# Patient Record
Sex: Female | Born: 1935 | Race: White | Hispanic: No
Health system: Southern US, Community
[De-identification: ages and names within clinical notes are randomized; demographics above are authoritative.]

---

## 2005-06-09 ENCOUNTER — Ambulatory Visit: Payer: Self-pay | Admitting: Internal Medicine

## 2008-01-08 ENCOUNTER — Ambulatory Visit: Payer: Self-pay | Admitting: Internal Medicine

## 2010-01-20 ENCOUNTER — Ambulatory Visit: Payer: Self-pay | Admitting: Internal Medicine

## 2013-06-04 ENCOUNTER — Ambulatory Visit: Payer: Self-pay | Admitting: Internal Medicine

## 2013-06-17 ENCOUNTER — Inpatient Hospital Stay: Payer: Self-pay | Admitting: Internal Medicine

## 2013-06-17 LAB — URINALYSIS, COMPLETE
Bacteria: NONE SEEN
Glucose,UR: NEGATIVE mg/dL (ref 0–75)
Hyaline Cast: 2
Leukocyte Esterase: NEGATIVE
Protein: 30
Specific Gravity: 1.01 (ref 1.003–1.030)
Squamous Epithelial: <1

## 2013-06-17 LAB — CBC WITH DIFFERENTIAL/PLATELET
HCT: 15.2 % — ABNORMAL LOW (ref 35.0–47.0)
HGB: 5.5 g/dL — ABNORMAL LOW (ref 12.0–16.0)
Lymphocytes: 80 %
MCV: 87 fL (ref 80–100)
RDW: 14.3 % (ref 11.5–14.5)
Segmented Neutrophils: 5 %

## 2013-06-17 LAB — PROTIME-INR
INR: 1.1
Prothrombin Time: 14.7 secs (ref 11.5–14.7)

## 2013-06-17 LAB — COMPREHENSIVE METABOLIC PANEL
Albumin: 3.4 g/dL (ref 3.4–5.0)
Alkaline Phosphatase: 56 U/L (ref 50–136)
Anion Gap: 6 — ABNORMAL LOW (ref 7–16)
BUN: 20 mg/dL — ABNORMAL HIGH (ref 7–18)
Bilirubin,Total: 0.3 mg/dL (ref 0.2–1.0)
Co2: 26 mmol/L (ref 21–32)
Creatinine: 1.08 mg/dL (ref 0.60–1.30)
EGFR (African American): 55 — ABNORMAL LOW
EGFR (Non-African Amer.): 47 — ABNORMAL LOW
Glucose: 131 mg/dL — ABNORMAL HIGH (ref 65–99)
Osmolality: 280 (ref 275–301)
SGOT(AST): 27 U/L (ref 15–37)

## 2013-06-17 LAB — TROPONIN I: Troponin-I: <0.02 ng/mL

## 2013-06-17 LAB — MAGNESIUM: Magnesium: 1.8 mg/dL

## 2013-06-18 LAB — BASIC METABOLIC PANEL
Anion Gap: 6 — ABNORMAL LOW (ref 7–16)
Chloride: 108 mmol/L — ABNORMAL HIGH (ref 98–107)
Creatinine: 0.92 mg/dL (ref 0.60–1.30)
EGFR (African American): >60
EGFR (Non-African Amer.): 57 — ABNORMAL LOW
Glucose: 109 mg/dL — ABNORMAL HIGH (ref 65–99)
Osmolality: 281 (ref 275–301)

## 2013-06-18 LAB — CBC WITH DIFFERENTIAL/PLATELET
HGB: 8.4 g/dL — ABNORMAL LOW (ref 12.0–16.0)
MCH: 30.8 pg (ref 26.0–34.0)
MCV: 87 fL (ref 80–100)
Platelet: 31 10*3/uL — ABNORMAL LOW (ref 150–440)
RBC: 2.72 10*6/uL — ABNORMAL LOW (ref 3.80–5.20)
Segmented Neutrophils: 6 %
WBC: 2.9 10*3/uL — ABNORMAL LOW (ref 3.6–11.0)

## 2013-06-18 LAB — URIC ACID: Uric Acid: 6.1 mg/dL — ABNORMAL HIGH (ref 2.6–6.0)

## 2013-07-04 ENCOUNTER — Ambulatory Visit: Payer: Self-pay | Admitting: Internal Medicine

## 2013-07-04 LAB — CBC CANCER CENTER
Basophil %: 0 %
Eosinophil #: 0 x10 3/mm (ref 0.0–0.7)
HCT: 33.2 % — ABNORMAL LOW (ref 35.0–47.0)
HGB: 11.2 g/dL — ABNORMAL LOW (ref 12.0–16.0)
MCH: 29.5 pg (ref 26.0–34.0)
MCV: 87 fL (ref 80–100)
Monocyte %: 2 %
Neutrophil #: 0 x10 3/mm — ABNORMAL LOW (ref 1.4–6.5)
Neutrophil %: 7.9 %
WBC: 0.4 x10 3/mm — CL (ref 3.6–11.0)

## 2013-07-05 ENCOUNTER — Ambulatory Visit: Payer: Self-pay | Admitting: Internal Medicine

## 2013-07-11 LAB — CBC CANCER CENTER
Basophil #: 0 "x10 3/mm "
Basophil %: 0.6 %
Eosinophil #: 0 "x10 3/mm "
Eosinophil %: 0.6 %
HCT: 24.2 % — ABNORMAL LOW
HGB: 8.3 g/dL — ABNORMAL LOW
Lymphocyte %: 79.5 %
Lymphs Abs: 0.3 "x10 3/mm " — ABNORMAL LOW
MCH: 29.6 pg
MCHC: 34.2 g/dL
MCV: 87 fL
Monocyte #: 0 "x10 3/mm " — ABNORMAL LOW
Monocyte %: 0.9 %
Neutrophil #: 0.1 "x10 3/mm " — ABNORMAL LOW
Neutrophil %: 18.4 %
Platelet: 31 "x10 3/mm " — ABNORMAL LOW
RBC: 2.79 "x10 6/mm " — ABNORMAL LOW
RDW: 12.7 %
WBC: 0.4 "x10 3/mm " — CL

## 2013-07-11 LAB — CANCER CTR PLATELET CT: Platelet: 51 "x10 3/mm " — ABNORMAL LOW

## 2013-07-14 LAB — CBC CANCER CENTER
Basophil %: 0.4 %
Eosinophil #: 0 x10 3/mm (ref 0.0–0.7)
Eosinophil %: 0.4 %
HCT: 27.6 % — ABNORMAL LOW (ref 35.0–47.0)
HGB: 9.4 g/dL — ABNORMAL LOW (ref 12.0–16.0)
Lymphocyte #: 0.3 x10 3/mm — ABNORMAL LOW (ref 1.0–3.6)
MCH: 29.5 pg (ref 26.0–34.0)
MCHC: 34.2 g/dL (ref 32.0–36.0)
MCV: 86 fL (ref 80–100)
Neutrophil #: 0.1 x10 3/mm — ABNORMAL LOW (ref 1.4–6.5)
RBC: 3.2 10*6/uL — ABNORMAL LOW (ref 3.80–5.20)
RDW: 13.4 % (ref 11.5–14.5)
WBC: 0.3 x10 3/mm — CL (ref 3.6–11.0)

## 2013-07-14 LAB — CANCER CTR PLATELET CT: Platelet: 54 x10 3/mm — ABNORMAL LOW (ref 150–440)

## 2013-07-17 LAB — CBC CANCER CENTER
Basophil #: 0 x10 3/mm (ref 0.0–0.1)
Eosinophil #: 0 x10 3/mm (ref 0.0–0.7)
Eosinophil %: 2.7 %
HCT: 23 % — ABNORMAL LOW (ref 35.0–47.0)
Lymphocyte #: 0.2 x10 3/mm — ABNORMAL LOW (ref 1.0–3.6)
Lymphocyte %: 86.7 %
MCV: 86 fL (ref 80–100)
Monocyte %: 0.7 %
Neutrophil %: 9.9 %
RBC: 2.66 10*6/uL — ABNORMAL LOW (ref 3.80–5.20)
RDW: 13 % (ref 11.5–14.5)
WBC: 0.3 x10 3/mm — CL (ref 3.6–11.0)

## 2013-07-18 LAB — CBC CANCER CENTER
Basophil #: 0 x10 3/mm (ref 0.0–0.1)
Eosinophil %: 3.3 %
HCT: 22.9 % — ABNORMAL LOW (ref 35.0–47.0)
HGB: 7.7 g/dL — ABNORMAL LOW (ref 12.0–16.0)
Lymphocyte #: 0.2 x10 3/mm — ABNORMAL LOW (ref 1.0–3.6)
Lymphocyte %: 88.1 %
MCH: 29.4 pg (ref 26.0–34.0)
MCHC: 33.7 g/dL (ref 32.0–36.0)
MCV: 87 fL (ref 80–100)
Monocyte #: 0 x10 3/mm — ABNORMAL LOW (ref 0.2–0.9)
WBC: 0.3 x10 3/mm — CL (ref 3.6–11.0)

## 2013-07-21 LAB — CBC CANCER CENTER
Eosinophil #: 0 x10 3/mm (ref 0.0–0.7)
Eosinophil %: 0.2 %
HGB: 8.5 g/dL — ABNORMAL LOW (ref 12.0–16.0)
MCH: 29.6 pg (ref 26.0–34.0)
MCHC: 33.8 g/dL (ref 32.0–36.0)
MCV: 87 fL (ref 80–100)
Monocyte #: 0 x10 3/mm — ABNORMAL LOW (ref 0.2–0.9)
Monocyte %: 0.7 %
Platelet: 68 x10 3/mm — ABNORMAL LOW (ref 150–440)
RBC: 2.88 10*6/uL — ABNORMAL LOW (ref 3.80–5.20)
RDW: 13.1 % (ref 11.5–14.5)

## 2013-07-21 LAB — COMPREHENSIVE METABOLIC PANEL
Alkaline Phosphatase: 81 U/L (ref 50–136)
Bilirubin,Total: 1 mg/dL (ref 0.2–1.0)
Calcium, Total: 9.7 mg/dL (ref 8.5–10.1)
Co2: 29 mmol/L (ref 21–32)
EGFR (Non-African Amer.): 49 — ABNORMAL LOW
Glucose: 165 mg/dL — ABNORMAL HIGH (ref 65–99)
Potassium: 4.3 mmol/L (ref 3.5–5.1)
SGPT (ALT): 59 U/L (ref 12–78)
Sodium: 138 mmol/L (ref 136–145)
Total Protein: 6.7 g/dL (ref 6.4–8.2)

## 2013-07-22 LAB — CBC CANCER CENTER
HGB: 8.7 g/dL — ABNORMAL LOW (ref 12.0–16.0)
Lymphocyte #: 0.3 x10 3/mm — ABNORMAL LOW (ref 1.0–3.6)
MCHC: 33.7 g/dL (ref 32.0–36.0)
MCV: 88 fL (ref 80–100)
Monocyte #: 0 x10 3/mm — ABNORMAL LOW (ref 0.2–0.9)
Neutrophil %: 60 %
RBC: 2.92 10*6/uL — ABNORMAL LOW (ref 3.80–5.20)

## 2013-07-24 LAB — CBC CANCER CENTER
Basophil %: 1 %
Eosinophil %: 0.4 %
HCT: 24.5 % — ABNORMAL LOW (ref 35.0–47.0)
Lymphocyte #: 0.2 x10 3/mm — ABNORMAL LOW (ref 1.0–3.6)
Lymphocyte %: 25.3 %
MCH: 29.6 pg (ref 26.0–34.0)
MCHC: 32.7 g/dL (ref 32.0–36.0)
MCV: 91 fL (ref 80–100)
Monocyte #: 0 x10 3/mm — ABNORMAL LOW (ref 0.2–0.9)
Platelet: 93 x10 3/mm — ABNORMAL LOW (ref 150–440)
RBC: 2.71 10*6/uL — ABNORMAL LOW (ref 3.80–5.20)
RDW: 13.8 % (ref 11.5–14.5)
WBC: 0.6 x10 3/mm — CL (ref 3.6–11.0)

## 2013-07-28 LAB — CBC CANCER CENTER
HCT: 27.3 % — ABNORMAL LOW (ref 35.0–47.0)
HGB: 9 g/dL — ABNORMAL LOW (ref 12.0–16.0)
Lymphocyte #: 0.2 x10 3/mm — ABNORMAL LOW (ref 1.0–3.6)
MCH: 29.9 pg (ref 26.0–34.0)
MCHC: 32.9 g/dL (ref 32.0–36.0)
MCV: 91 fL (ref 80–100)
Monocyte #: 0 x10 3/mm — ABNORMAL LOW (ref 0.2–0.9)
Platelet: 159 x10 3/mm (ref 150–440)
RBC: 3.01 10*6/uL — ABNORMAL LOW (ref 3.80–5.20)
RDW: 13.9 % (ref 11.5–14.5)
WBC: 0.5 x10 3/mm — CL (ref 3.6–11.0)

## 2013-07-28 LAB — HEPATIC FUNCTION PANEL A (ARMC)
Alkaline Phosphatase: 61 U/L
Bilirubin, Direct: 0.3 mg/dL — ABNORMAL HIGH (ref 0.00–0.20)
Total Protein: 6.7 g/dL (ref 6.4–8.2)

## 2013-07-28 LAB — CREATININE, SERUM
Creatinine: 1.18 mg/dL (ref 0.60–1.30)
EGFR (Non-African Amer.): 42 — ABNORMAL LOW

## 2013-08-01 LAB — CBC CANCER CENTER
Eosinophil #: 0 x10 3/mm (ref 0.0–0.7)
HCT: 28.6 % — ABNORMAL LOW (ref 35.0–47.0)
HGB: 9.2 g/dL — ABNORMAL LOW (ref 12.0–16.0)
Lymphocyte #: 0.2 x10 3/mm — ABNORMAL LOW (ref 1.0–3.6)
MCH: 30.9 pg (ref 26.0–34.0)
MCHC: 32.3 g/dL (ref 32.0–36.0)
MCV: 96 fL (ref 80–100)
Monocyte %: 8 %
Neutrophil %: 65.2 %
Platelet: 226 x10 3/mm (ref 150–440)
RBC: 2.99 10*6/uL — ABNORMAL LOW (ref 3.80–5.20)
RDW: 14.4 % (ref 11.5–14.5)
WBC: 0.9 x10 3/mm — CL (ref 3.6–11.0)

## 2013-08-04 ENCOUNTER — Ambulatory Visit: Payer: Self-pay | Admitting: Internal Medicine

## 2013-08-04 LAB — CBC CANCER CENTER
Basophil #: 0 x10 3/mm (ref 0.0–0.1)
HCT: 28.2 % — ABNORMAL LOW (ref 35.0–47.0)
HGB: 9.3 g/dL — ABNORMAL LOW (ref 12.0–16.0)
Lymphocyte #: 0.2 x10 3/mm — ABNORMAL LOW (ref 1.0–3.6)
MCH: 32.1 pg (ref 26.0–34.0)
Neutrophil %: 74.4 %
Platelet: 271 x10 3/mm (ref 150–440)
RBC: 2.89 10*6/uL — ABNORMAL LOW (ref 3.80–5.20)
RDW: 15.4 % — ABNORMAL HIGH (ref 11.5–14.5)
WBC: 1.5 x10 3/mm — CL (ref 3.6–11.0)

## 2013-08-04 LAB — COMPREHENSIVE METABOLIC PANEL
Alkaline Phosphatase: 51 U/L
BUN: 20 mg/dL — ABNORMAL HIGH (ref 7–18)
Bilirubin,Total: 0.6 mg/dL (ref 0.2–1.0)
Co2: 30 mmol/L (ref 21–32)
Glucose: 148 mg/dL — ABNORMAL HIGH (ref 65–99)
Osmolality: 283 (ref 275–301)
SGOT(AST): 53 U/L — ABNORMAL HIGH (ref 15–37)
SGPT (ALT): 82 U/L — ABNORMAL HIGH (ref 12–78)
Sodium: 139 mmol/L (ref 136–145)
Total Protein: 6.5 g/dL (ref 6.4–8.2)

## 2013-08-11 LAB — COMPREHENSIVE METABOLIC PANEL
Albumin: 3.4 g/dL (ref 3.4–5.0)
Alkaline Phosphatase: 54 U/L
Anion Gap: 6 — ABNORMAL LOW (ref 7–16)
Bilirubin,Total: 1.2 mg/dL — ABNORMAL HIGH (ref 0.2–1.0)
Calcium, Total: 9.2 mg/dL (ref 8.5–10.1)
Co2: 31 mmol/L (ref 21–32)
Creatinine: 0.8 mg/dL (ref 0.60–1.30)
EGFR (Non-African Amer.): >60
Osmolality: 283 (ref 275–301)
Potassium: 4.3 mmol/L (ref 3.5–5.1)
SGOT(AST): 72 U/L — ABNORMAL HIGH (ref 15–37)
SGPT (ALT): 123 U/L — ABNORMAL HIGH (ref 12–78)
Sodium: 136 mmol/L (ref 136–145)
Total Protein: 6.3 g/dL — ABNORMAL LOW (ref 6.4–8.2)

## 2013-08-11 LAB — CBC CANCER CENTER
Basophil #: 0 x10 3/mm (ref 0.0–0.1)
Basophil %: 0.1 %
Eosinophil #: 0 x10 3/mm (ref 0.0–0.7)
Eosinophil %: 0 %
Lymphocyte #: 0.2 x10 3/mm — ABNORMAL LOW (ref 1.0–3.6)
Lymphocyte %: 6.8 %
MCH: 32.7 pg (ref 26.0–34.0)
MCV: 99 fL (ref 80–100)
Monocyte #: 0 x10 3/mm — ABNORMAL LOW (ref 0.2–0.9)
Platelet: 275 x10 3/mm (ref 150–440)
RBC: 2.72 10*6/uL — ABNORMAL LOW (ref 3.80–5.20)
RDW: 24.1 % — ABNORMAL HIGH (ref 11.5–14.5)
WBC: 2.4 x10 3/mm — ABNORMAL LOW (ref 3.6–11.0)

## 2013-08-18 LAB — HEPATIC FUNCTION PANEL A (ARMC)
Albumin: 3.5 g/dL (ref 3.4–5.0)
Alkaline Phosphatase: 57 U/L
Bilirubin, Direct: 0.2 mg/dL (ref 0.00–0.20)
SGPT (ALT): 135 U/L — ABNORMAL HIGH (ref 12–78)

## 2013-08-18 LAB — CREATININE, SERUM
Creatinine: 0.85 mg/dL (ref 0.60–1.30)
EGFR (Non-African Amer.): >60

## 2013-08-18 LAB — CBC CANCER CENTER
Basophil #: 0 x10 3/mm (ref 0.0–0.1)
Eosinophil #: 0 x10 3/mm (ref 0.0–0.7)
Eosinophil %: 1.7 %
HCT: 29 % — ABNORMAL LOW (ref 35.0–47.0)
HGB: 9.4 g/dL — ABNORMAL LOW (ref 12.0–16.0)
Lymphocyte #: 0.2 x10 3/mm — ABNORMAL LOW (ref 1.0–3.6)
Lymphocyte %: 17.8 %
MCV: 103 fL — ABNORMAL HIGH (ref 80–100)
Monocyte #: 0.3 x10 3/mm (ref 0.2–0.9)
Neutrophil #: 0.7 x10 3/mm — ABNORMAL LOW (ref 1.4–6.5)
RBC: 2.83 10*6/uL — ABNORMAL LOW (ref 3.80–5.20)
RDW: 27.3 % — ABNORMAL HIGH (ref 11.5–14.5)

## 2013-08-25 LAB — CREATININE, SERUM
Creatinine: 0.89 mg/dL (ref 0.60–1.30)
EGFR (Non-African Amer.): 60 — ABNORMAL LOW

## 2013-08-25 LAB — CBC CANCER CENTER
Basophil #: 0.1 x10 3/mm (ref 0.0–0.1)
Basophil %: 2.9 %
HCT: 36.2 % (ref 35.0–47.0)
Lymphocyte #: 0.3 x10 3/mm — ABNORMAL LOW (ref 1.0–3.6)
MCH: 33.4 pg (ref 26.0–34.0)
MCV: 103 fL — ABNORMAL HIGH (ref 80–100)
Monocyte #: 0.5 x10 3/mm (ref 0.2–0.9)
Neutrophil %: 70.6 %
Platelet: 300 x10 3/mm (ref 150–440)
RBC: 3.51 10*6/uL — ABNORMAL LOW (ref 3.80–5.20)
RDW: 26.3 % — ABNORMAL HIGH (ref 11.5–14.5)
WBC: 3.4 x10 3/mm — ABNORMAL LOW (ref 3.6–11.0)

## 2013-08-25 LAB — HEPATIC FUNCTION PANEL A (ARMC)
Bilirubin, Direct: 0.1 mg/dL (ref 0.00–0.20)
SGPT (ALT): 164 U/L — ABNORMAL HIGH (ref 12–78)
Total Protein: 6.4 g/dL (ref 6.4–8.2)

## 2013-08-25 LAB — GLUCOSE, RANDOM: Glucose: 151 mg/dL — ABNORMAL HIGH (ref 65–99)

## 2013-09-01 LAB — CBC CANCER CENTER
Basophil #: 0.1 x10 3/mm (ref 0.0–0.1)
Basophil %: 2.9 %
Eosinophil %: 1.8 %
HCT: 35.9 % (ref 35.0–47.0)
Lymphocyte %: 11.1 %
MCV: 104 fL — ABNORMAL HIGH (ref 80–100)
Monocyte #: 0.3 x10 3/mm (ref 0.2–0.9)
Monocyte %: 9.4 %
Neutrophil #: 2.7 x10 3/mm (ref 1.4–6.5)
Neutrophil %: 74.8 %
Platelet: 218 x10 3/mm (ref 150–440)
RBC: 3.46 10*6/uL — ABNORMAL LOW (ref 3.80–5.20)
RDW: 22.9 % — ABNORMAL HIGH (ref 11.5–14.5)
WBC: 3.6 x10 3/mm (ref 3.6–11.0)

## 2013-09-01 LAB — HEPATIC FUNCTION PANEL A (ARMC)
Albumin: 3.6 g/dL (ref 3.4–5.0)
Alkaline Phosphatase: 50 U/L
Bilirubin, Direct: 0.2 mg/dL (ref 0.00–0.20)
SGOT(AST): 23 U/L (ref 15–37)
SGPT (ALT): 63 U/L (ref 12–78)
Total Protein: 6.3 g/dL — ABNORMAL LOW (ref 6.4–8.2)

## 2013-09-01 LAB — CREATININE, SERUM
EGFR (African American): >60
EGFR (Non-African Amer.): >60

## 2013-09-04 ENCOUNTER — Ambulatory Visit: Payer: Self-pay | Admitting: Internal Medicine

## 2013-09-08 LAB — CBC CANCER CENTER
BASOS PCT: 0.2 %
Basophil #: 0 x10 3/mm (ref 0.0–0.1)
Eosinophil #: 0 x10 3/mm (ref 0.0–0.7)
Eosinophil %: 0.2 %
HCT: 35.4 % (ref 35.0–47.0)
HGB: 11.6 g/dL — AB (ref 12.0–16.0)
Lymphocyte #: 0.3 x10 3/mm — ABNORMAL LOW (ref 1.0–3.6)
Lymphocyte %: 7.3 %
MCH: 34.1 pg — ABNORMAL HIGH (ref 26.0–34.0)
MCHC: 32.9 g/dL (ref 32.0–36.0)
MCV: 104 fL — ABNORMAL HIGH (ref 80–100)
Monocyte #: 0.2 x10 3/mm (ref 0.2–0.9)
Monocyte %: 3.6 %
NEUTROS ABS: 4.1 x10 3/mm (ref 1.4–6.5)
NEUTROS PCT: 88.7 %
Platelet: 187 x10 3/mm (ref 150–440)
RBC: 3.41 10*6/uL — AB (ref 3.80–5.20)
RDW: 20.1 % — ABNORMAL HIGH (ref 11.5–14.5)
WBC: 4.6 x10 3/mm (ref 3.6–11.0)

## 2013-09-08 LAB — COMPREHENSIVE METABOLIC PANEL
ALBUMIN: 3.6 g/dL (ref 3.4–5.0)
ANION GAP: 9 (ref 7–16)
Alkaline Phosphatase: 51 U/L
BUN: 19 mg/dL — ABNORMAL HIGH (ref 7–18)
Bilirubin,Total: 0.7 mg/dL (ref 0.2–1.0)
CREATININE: 0.77 mg/dL (ref 0.60–1.30)
Calcium, Total: 9.5 mg/dL (ref 8.5–10.1)
Chloride: 99 mmol/L (ref 98–107)
Co2: 31 mmol/L (ref 21–32)
EGFR (African American): >60
EGFR (Non-African Amer.): >60
Glucose: 271 mg/dL — ABNORMAL HIGH (ref 65–99)
Osmolality: 289 (ref 275–301)
Potassium: 3.8 mmol/L (ref 3.5–5.1)
SGOT(AST): 24 U/L (ref 15–37)
SGPT (ALT): 73 U/L (ref 12–78)
Sodium: 139 mmol/L (ref 136–145)
Total Protein: 6 g/dL — ABNORMAL LOW (ref 6.4–8.2)

## 2013-09-15 LAB — CBC CANCER CENTER
BASOS PCT: 1 %
Basophil #: 0 x10 3/mm (ref 0.0–0.1)
Eosinophil #: 0.1 x10 3/mm (ref 0.0–0.7)
Eosinophil %: 2 %
HCT: 36.7 % (ref 35.0–47.0)
HGB: 12 g/dL (ref 12.0–16.0)
LYMPHS ABS: 0.2 x10 3/mm — AB (ref 1.0–3.6)
LYMPHS PCT: 8.9 %
MCH: 34.6 pg — ABNORMAL HIGH (ref 26.0–34.0)
MCHC: 32.6 g/dL (ref 32.0–36.0)
MCV: 106 fL — ABNORMAL HIGH (ref 80–100)
MONO ABS: 0.4 x10 3/mm (ref 0.2–0.9)
Monocyte %: 14.4 %
NEUTROS ABS: 1.9 x10 3/mm (ref 1.4–6.5)
Neutrophil %: 73.7 %
Platelet: 177 x10 3/mm (ref 150–440)
RBC: 3.46 10*6/uL — ABNORMAL LOW (ref 3.80–5.20)
RDW: 19.7 % — ABNORMAL HIGH (ref 11.5–14.5)
WBC: 2.6 x10 3/mm — ABNORMAL LOW (ref 3.6–11.0)

## 2013-09-15 LAB — CREATININE, SERUM
Creatinine: 0.78 mg/dL (ref 0.60–1.30)
EGFR (African American): >60
EGFR (Non-African Amer.): >60

## 2013-09-15 LAB — HEPATIC FUNCTION PANEL A (ARMC)
ALBUMIN: 4 g/dL (ref 3.4–5.0)
ALK PHOS: 56 U/L
AST: 48 U/L — AB (ref 15–37)
BILIRUBIN DIRECT: 0.2 mg/dL (ref 0.00–0.20)
BILIRUBIN TOTAL: 0.5 mg/dL (ref 0.2–1.0)
SGPT (ALT): 119 U/L — ABNORMAL HIGH (ref 12–78)
Total Protein: 6.4 g/dL (ref 6.4–8.2)

## 2013-09-22 LAB — HEPATIC FUNCTION PANEL A (ARMC)
ALK PHOS: 57 U/L
AST: 62 U/L — AB (ref 15–37)
Albumin: 3.9 g/dL (ref 3.4–5.0)
BILIRUBIN DIRECT: 0.1 mg/dL (ref 0.00–0.20)
Bilirubin,Total: 0.4 mg/dL (ref 0.2–1.0)
SGPT (ALT): 131 U/L — ABNORMAL HIGH (ref 12–78)
Total Protein: 6.5 g/dL (ref 6.4–8.2)

## 2013-09-22 LAB — CBC CANCER CENTER
BASOS PCT: 1.6 %
Basophil #: 0 x10 3/mm (ref 0.0–0.1)
EOS PCT: 1.5 %
Eosinophil #: 0 x10 3/mm (ref 0.0–0.7)
HCT: 37 % (ref 35.0–47.0)
HGB: 12.1 g/dL (ref 12.0–16.0)
Lymphocyte #: 0.3 x10 3/mm — ABNORMAL LOW (ref 1.0–3.6)
Lymphocyte %: 15.2 %
MCH: 35.1 pg — ABNORMAL HIGH (ref 26.0–34.0)
MCHC: 32.9 g/dL (ref 32.0–36.0)
MCV: 107 fL — ABNORMAL HIGH (ref 80–100)
MONO ABS: 0.2 x10 3/mm (ref 0.2–0.9)
Monocyte %: 12.9 %
NEUTROS PCT: 68.8 %
Neutrophil #: 1.3 x10 3/mm — ABNORMAL LOW (ref 1.4–6.5)
Platelet: 214 x10 3/mm (ref 150–440)
RBC: 3.46 10*6/uL — ABNORMAL LOW (ref 3.80–5.20)
RDW: 18.4 % — AB (ref 11.5–14.5)
WBC: 1.9 x10 3/mm — CL (ref 3.6–11.0)

## 2013-09-22 LAB — CREATININE, SERUM
Creatinine: 0.7 mg/dL (ref 0.60–1.30)
EGFR (African American): >60
EGFR (Non-African Amer.): >60

## 2013-09-29 LAB — CREATININE, SERUM
Creatinine: 0.93 mg/dL (ref 0.60–1.30)
EGFR (African American): >60
EGFR (Non-African Amer.): 56 — ABNORMAL LOW

## 2013-09-29 LAB — CBC CANCER CENTER
BASOS PCT: 1 %
Basophil #: 0 x10 3/mm (ref 0.0–0.1)
EOS ABS: 0.1 x10 3/mm (ref 0.0–0.7)
Eosinophil %: 2.7 %
HCT: 37.8 % (ref 35.0–47.0)
HGB: 12.3 g/dL (ref 12.0–16.0)
Lymphocyte #: 0.3 x10 3/mm — ABNORMAL LOW (ref 1.0–3.6)
Lymphocyte %: 10.3 %
MCH: 35.1 pg — ABNORMAL HIGH (ref 26.0–34.0)
MCHC: 32.5 g/dL (ref 32.0–36.0)
MCV: 108 fL — ABNORMAL HIGH (ref 80–100)
MONO ABS: 0.5 x10 3/mm (ref 0.2–0.9)
MONOS PCT: 17.8 %
Neutrophil #: 1.9 x10 3/mm (ref 1.4–6.5)
Neutrophil %: 68.2 %
Platelet: 204 x10 3/mm (ref 150–440)
RBC: 3.5 10*6/uL — ABNORMAL LOW (ref 3.80–5.20)
RDW: 17.7 % — ABNORMAL HIGH (ref 11.5–14.5)
WBC: 2.8 x10 3/mm — ABNORMAL LOW (ref 3.6–11.0)

## 2013-09-29 LAB — HEPATIC FUNCTION PANEL A (ARMC)
ALT: 81 U/L — AB (ref 12–78)
AST: 40 U/L — AB (ref 15–37)
Albumin: 3.5 g/dL (ref 3.4–5.0)
Alkaline Phosphatase: 53 U/L
BILIRUBIN DIRECT: 0.1 mg/dL (ref 0.00–0.20)
Bilirubin,Total: 0.5 mg/dL (ref 0.2–1.0)
TOTAL PROTEIN: 6 g/dL — AB (ref 6.4–8.2)

## 2013-10-01 LAB — HEPATIC FUNCTION PANEL A (ARMC)
Albumin: 3.6 g/dL (ref 3.4–5.0)
Alkaline Phosphatase: 57 U/L
BILIRUBIN TOTAL: 0.5 mg/dL (ref 0.2–1.0)
Bilirubin, Direct: 0.1 mg/dL (ref 0.00–0.20)
SGOT(AST): 46 U/L — ABNORMAL HIGH (ref 15–37)
SGPT (ALT): 88 U/L — ABNORMAL HIGH (ref 12–78)
Total Protein: 6.3 g/dL — ABNORMAL LOW (ref 6.4–8.2)

## 2013-10-05 ENCOUNTER — Ambulatory Visit: Payer: Self-pay | Admitting: Internal Medicine

## 2013-10-06 LAB — CBC CANCER CENTER
BASOS ABS: 0 x10 3/mm (ref 0.0–0.1)
BASOS PCT: 0.4 %
Eosinophil #: 0 x10 3/mm (ref 0.0–0.7)
Eosinophil %: 0.3 %
HCT: 40.2 % (ref 35.0–47.0)
HGB: 13.2 g/dL (ref 12.0–16.0)
LYMPHS ABS: 0.1 x10 3/mm — AB (ref 1.0–3.6)
LYMPHS PCT: 8.3 %
MCH: 34.6 pg — ABNORMAL HIGH (ref 26.0–34.0)
MCHC: 32.7 g/dL (ref 32.0–36.0)
MCV: 106 fL — AB (ref 80–100)
MONO ABS: 0 x10 3/mm — AB (ref 0.2–0.9)
Monocyte %: 4 %
NEUTROS PCT: 87 %
Neutrophil #: 0.8 x10 3/mm — ABNORMAL LOW (ref 1.4–6.5)
Platelet: 195 x10 3/mm (ref 150–440)
RBC: 3.81 10*6/uL (ref 3.80–5.20)
RDW: 16.5 % — ABNORMAL HIGH (ref 11.5–14.5)
WBC: 0.9 x10 3/mm — AB (ref 3.6–11.0)

## 2013-10-06 LAB — HEPATIC FUNCTION PANEL A (ARMC)
ALBUMIN: 3.8 g/dL (ref 3.4–5.0)
ALK PHOS: 58 U/L
ALT: 130 U/L — AB (ref 12–78)
Bilirubin, Direct: 0.1 mg/dL (ref 0.00–0.20)
Bilirubin,Total: 0.5 mg/dL (ref 0.2–1.0)
SGOT(AST): 58 U/L — ABNORMAL HIGH (ref 15–37)
Total Protein: 6.5 g/dL (ref 6.4–8.2)

## 2013-10-06 LAB — CREATININE, SERUM
CREATININE: 0.79 mg/dL (ref 0.60–1.30)
EGFR (African American): >60
EGFR (Non-African Amer.): >60

## 2013-10-13 LAB — HEPATIC FUNCTION PANEL A (ARMC)
ALBUMIN: 3.8 g/dL (ref 3.4–5.0)
ALK PHOS: 59 U/L
ALK PHOS: 64 U/L
ALT: 181 U/L — AB (ref 12–78)
AST: 60 U/L — AB (ref 15–37)
Albumin: 3.4 g/dL (ref 3.4–5.0)
BILIRUBIN DIRECT: 0.1 mg/dL (ref 0.00–0.20)
BILIRUBIN TOTAL: 0.5 mg/dL (ref 0.2–1.0)
Bilirubin, Direct: 0.1 mg/dL (ref 0.00–0.20)
Bilirubin,Total: 0.4 mg/dL (ref 0.2–1.0)
SGOT(AST): 63 U/L — ABNORMAL HIGH (ref 15–37)
SGPT (ALT): 169 U/L — ABNORMAL HIGH (ref 12–78)
TOTAL PROTEIN: 6 g/dL — AB (ref 6.4–8.2)
TOTAL PROTEIN: 6.5 g/dL (ref 6.4–8.2)

## 2013-10-13 LAB — CBC CANCER CENTER
Basophil #: 0.1 x10 3/mm (ref 0.0–0.1)
Basophil %: 5.1 %
EOS PCT: 2.8 %
Eosinophil #: 0.1 x10 3/mm (ref 0.0–0.7)
HCT: 39.2 % (ref 35.0–47.0)
HGB: 13.1 g/dL (ref 12.0–16.0)
LYMPHS ABS: 0.4 x10 3/mm — AB (ref 1.0–3.6)
Lymphocyte %: 16.4 %
MCH: 34.4 pg — ABNORMAL HIGH (ref 26.0–34.0)
MCHC: 33.4 g/dL (ref 32.0–36.0)
MCV: 103 fL — AB (ref 80–100)
MONOS PCT: 27.9 %
Monocyte #: 0.8 x10 3/mm (ref 0.2–0.9)
Neutrophil #: 1.3 x10 3/mm — ABNORMAL LOW (ref 1.4–6.5)
Neutrophil %: 47.8 %
Platelet: 212 x10 3/mm (ref 150–440)
RBC: 3.81 10*6/uL (ref 3.80–5.20)
RDW: 16.3 % — AB (ref 11.5–14.5)
WBC: 2.7 x10 3/mm — ABNORMAL LOW (ref 3.6–11.0)

## 2013-10-13 LAB — CREATININE, SERUM
CREATININE: 0.75 mg/dL (ref 0.60–1.30)
EGFR (Non-African Amer.): >60

## 2013-10-20 LAB — CBC CANCER CENTER
Basophil #: 0 x10 3/mm (ref 0.0–0.1)
Basophil %: 0.4 %
EOS ABS: 0 x10 3/mm (ref 0.0–0.7)
Eosinophil %: 0.9 %
HCT: 41 % (ref 35.0–47.0)
HGB: 13.6 g/dL (ref 12.0–16.0)
Lymphocyte #: 0.6 x10 3/mm — ABNORMAL LOW (ref 1.0–3.6)
Lymphocyte %: 11.4 %
MCH: 34.2 pg — ABNORMAL HIGH (ref 26.0–34.0)
MCHC: 33.2 g/dL (ref 32.0–36.0)
MCV: 103 fL — AB (ref 80–100)
MONO ABS: 0.6 x10 3/mm (ref 0.2–0.9)
MONOS PCT: 12.6 %
NEUTROS PCT: 74.7 %
Neutrophil #: 3.8 x10 3/mm (ref 1.4–6.5)
Platelet: 227 x10 3/mm (ref 150–440)
RBC: 3.97 10*6/uL (ref 3.80–5.20)
RDW: 15.6 % — ABNORMAL HIGH (ref 11.5–14.5)
WBC: 5 x10 3/mm (ref 3.6–11.0)

## 2013-10-20 LAB — HEPATIC FUNCTION PANEL A (ARMC)
ALBUMIN: 3.4 g/dL (ref 3.4–5.0)
Alkaline Phosphatase: 55 U/L
Bilirubin, Direct: 0.1 mg/dL (ref 0.00–0.20)
Bilirubin,Total: 0.3 mg/dL (ref 0.2–1.0)
SGOT(AST): 23 U/L (ref 15–37)
SGPT (ALT): 54 U/L (ref 12–78)
Total Protein: 6 g/dL — ABNORMAL LOW (ref 6.4–8.2)

## 2013-10-20 LAB — CREATININE, SERUM
CREATININE: 0.95 mg/dL (ref 0.60–1.30)
EGFR (African American): >60
GFR CALC NON AF AMER: 55 — AB

## 2013-10-27 LAB — HEPATIC FUNCTION PANEL A (ARMC)
ALBUMIN: 3.4 g/dL (ref 3.4–5.0)
ALK PHOS: 51 U/L
ALT: 33 U/L (ref 12–78)
BILIRUBIN TOTAL: 0.4 mg/dL (ref 0.2–1.0)
Bilirubin, Direct: 0.1 mg/dL (ref 0.00–0.20)
SGOT(AST): 23 U/L (ref 15–37)
Total Protein: 6 g/dL — ABNORMAL LOW (ref 6.4–8.2)

## 2013-10-27 LAB — CREATININE, SERUM
Creatinine: 0.81 mg/dL (ref 0.60–1.30)
EGFR (Non-African Amer.): >60

## 2013-10-27 LAB — CBC CANCER CENTER
Basophil #: 0 x10 3/mm (ref 0.0–0.1)
Basophil %: 0.6 %
EOS ABS: 0.1 x10 3/mm (ref 0.0–0.7)
EOS PCT: 1.9 %
HCT: 40.3 % (ref 35.0–47.0)
HGB: 13.2 g/dL (ref 12.0–16.0)
LYMPHS ABS: 0.4 x10 3/mm — AB (ref 1.0–3.6)
Lymphocyte %: 6.8 %
MCH: 33.8 pg (ref 26.0–34.0)
MCHC: 32.6 g/dL (ref 32.0–36.0)
MCV: 104 fL — ABNORMAL HIGH (ref 80–100)
Monocyte #: 0.6 x10 3/mm (ref 0.2–0.9)
Monocyte %: 10.7 %
NEUTROS ABS: 4.5 x10 3/mm (ref 1.4–6.5)
Neutrophil %: 80 %
Platelet: 156 x10 3/mm (ref 150–440)
RBC: 3.89 10*6/uL (ref 3.80–5.20)
RDW: 14.5 % (ref 11.5–14.5)
WBC: 5.6 x10 3/mm (ref 3.6–11.0)

## 2013-11-02 ENCOUNTER — Ambulatory Visit: Payer: Self-pay | Admitting: Internal Medicine

## 2013-11-03 LAB — CBC CANCER CENTER
BASOS ABS: 0 x10 3/mm (ref 0.0–0.1)
BASOS PCT: 0.4 %
EOS ABS: 0.1 x10 3/mm (ref 0.0–0.7)
Eosinophil %: 1.4 %
HCT: 39 % (ref 35.0–47.0)
HGB: 13 g/dL (ref 12.0–16.0)
LYMPHS ABS: 0.6 x10 3/mm — AB (ref 1.0–3.6)
LYMPHS PCT: 10.1 %
MCH: 33.9 pg (ref 26.0–34.0)
MCHC: 33.2 g/dL (ref 32.0–36.0)
MCV: 102 fL — ABNORMAL HIGH (ref 80–100)
MONO ABS: 0.1 x10 3/mm — AB (ref 0.2–0.9)
MONOS PCT: 2.2 %
Neutrophil #: 5 x10 3/mm (ref 1.4–6.5)
Neutrophil %: 85.9 %
PLATELETS: 157 x10 3/mm (ref 150–440)
RBC: 3.82 10*6/uL (ref 3.80–5.20)
RDW: 14 % (ref 11.5–14.5)
WBC: 5.8 x10 3/mm (ref 3.6–11.0)

## 2013-11-03 LAB — COMPREHENSIVE METABOLIC PANEL
ALK PHOS: 50 U/L
ALT: 53 U/L (ref 12–78)
ANION GAP: 6 — AB (ref 7–16)
Albumin: 3.5 g/dL (ref 3.4–5.0)
BUN: 20 mg/dL — AB (ref 7–18)
Bilirubin,Total: 0.6 mg/dL (ref 0.2–1.0)
CO2: 33 mmol/L — AB (ref 21–32)
CREATININE: 0.74 mg/dL (ref 0.60–1.30)
Calcium, Total: 8.9 mg/dL (ref 8.5–10.1)
Chloride: 100 mmol/L (ref 98–107)
EGFR (African American): >60
EGFR (Non-African Amer.): >60
Glucose: 190 mg/dL — ABNORMAL HIGH (ref 65–99)
OSMOLALITY: 285 (ref 275–301)
POTASSIUM: 3.7 mmol/L (ref 3.5–5.1)
SGOT(AST): 20 U/L (ref 15–37)
Sodium: 139 mmol/L (ref 136–145)
TOTAL PROTEIN: 6 g/dL — AB (ref 6.4–8.2)

## 2013-11-10 LAB — COMPREHENSIVE METABOLIC PANEL
ALT: 169 U/L — AB (ref 12–78)
Albumin: 3.8 g/dL (ref 3.4–5.0)
Alkaline Phosphatase: 57 U/L
Anion Gap: 9 (ref 7–16)
BUN: 14 mg/dL (ref 7–18)
Bilirubin,Total: 0.6 mg/dL (ref 0.2–1.0)
Calcium, Total: 10.3 mg/dL — ABNORMAL HIGH (ref 8.5–10.1)
Chloride: 100 mmol/L (ref 98–107)
Co2: 31 mmol/L (ref 21–32)
Creatinine: 0.8 mg/dL (ref 0.60–1.30)
EGFR (African American): >60
EGFR (Non-African Amer.): >60
Glucose: 219 mg/dL — ABNORMAL HIGH (ref 65–99)
OSMOLALITY: 287 (ref 275–301)
Potassium: 3.6 mmol/L (ref 3.5–5.1)
SGOT(AST): 60 U/L — ABNORMAL HIGH (ref 15–37)
Sodium: 140 mmol/L (ref 136–145)
Total Protein: 6.6 g/dL (ref 6.4–8.2)

## 2013-11-10 LAB — CBC CANCER CENTER
BASOS PCT: 4.3 %
Basophil #: 0.1 x10 3/mm (ref 0.0–0.1)
EOS PCT: 6.1 %
Eosinophil #: 0.1 x10 3/mm (ref 0.0–0.7)
HCT: 40.1 % (ref 35.0–47.0)
HGB: 13.2 g/dL (ref 12.0–16.0)
LYMPHS PCT: 26.1 %
Lymphocyte #: 0.3 x10 3/mm — ABNORMAL LOW (ref 1.0–3.6)
MCH: 33.9 pg (ref 26.0–34.0)
MCHC: 33 g/dL (ref 32.0–36.0)
MCV: 103 fL — AB (ref 80–100)
MONO ABS: 0.2 x10 3/mm (ref 0.2–0.9)
Monocyte %: 16.4 %
Neutrophil #: 0.5 x10 3/mm — ABNORMAL LOW (ref 1.4–6.5)
Neutrophil %: 47.1 %
Platelet: 225 x10 3/mm (ref 150–440)
RBC: 3.91 10*6/uL (ref 3.80–5.20)
RDW: 14.5 % (ref 11.5–14.5)
WBC: 1.2 x10 3/mm — AB (ref 3.6–11.0)

## 2013-11-17 LAB — CBC CANCER CENTER
BASOS ABS: 0.1 x10 3/mm (ref 0.0–0.1)
Basophil %: 3.6 %
EOS ABS: 0.1 x10 3/mm (ref 0.0–0.7)
Eosinophil %: 2.5 %
HCT: 41.6 % (ref 35.0–47.0)
HGB: 13.8 g/dL (ref 12.0–16.0)
LYMPHS PCT: 14.9 %
Lymphocyte #: 0.4 x10 3/mm — ABNORMAL LOW (ref 1.0–3.6)
MCH: 33.9 pg (ref 26.0–34.0)
MCHC: 33.3 g/dL (ref 32.0–36.0)
MCV: 102 fL — AB (ref 80–100)
Monocyte #: 0.6 x10 3/mm (ref 0.2–0.9)
Monocyte %: 24.4 %
NEUTROS ABS: 1.3 x10 3/mm — AB (ref 1.4–6.5)
NEUTROS PCT: 54.6 %
Platelet: 207 x10 3/mm (ref 150–440)
RBC: 4.09 10*6/uL (ref 3.80–5.20)
RDW: 14.8 % — AB (ref 11.5–14.5)
WBC: 2.4 x10 3/mm — ABNORMAL LOW (ref 3.6–11.0)

## 2013-11-17 LAB — COMPREHENSIVE METABOLIC PANEL
ALT: 134 U/L — AB (ref 12–78)
AST: 46 U/L — AB (ref 15–37)
Albumin: 3.8 g/dL (ref 3.4–5.0)
Alkaline Phosphatase: 53 U/L
Anion Gap: 6 — ABNORMAL LOW (ref 7–16)
BUN: 16 mg/dL (ref 7–18)
Bilirubin,Total: 0.6 mg/dL (ref 0.2–1.0)
CALCIUM: 9.9 mg/dL (ref 8.5–10.1)
CO2: 32 mmol/L (ref 21–32)
Chloride: 103 mmol/L (ref 98–107)
Creatinine: 0.86 mg/dL (ref 0.60–1.30)
Glucose: 78 mg/dL (ref 65–99)
Osmolality: 281 (ref 275–301)
Potassium: 4.2 mmol/L (ref 3.5–5.1)
Sodium: 141 mmol/L (ref 136–145)
Total Protein: 6.4 g/dL (ref 6.4–8.2)

## 2013-11-19 LAB — CBC CANCER CENTER
BASOS ABS: 0 x10 3/mm (ref 0.0–0.1)
BASOS PCT: 0.3 %
Eosinophil #: 0 x10 3/mm (ref 0.0–0.7)
Eosinophil %: 2 %
HCT: 41.4 % (ref 35.0–47.0)
HGB: 13.9 g/dL (ref 12.0–16.0)
Lymphocyte #: 0.3 x10 3/mm — ABNORMAL LOW (ref 1.0–3.6)
Lymphocyte %: 14.3 %
MCH: 34 pg (ref 26.0–34.0)
MCHC: 33.6 g/dL (ref 32.0–36.0)
MCV: 101 fL — AB (ref 80–100)
Monocyte #: 0.5 x10 3/mm (ref 0.2–0.9)
Monocyte %: 24.8 %
NEUTROS ABS: 1.3 x10 3/mm — AB (ref 1.4–6.5)
NEUTROS PCT: 58.6 %
Platelet: 189 x10 3/mm (ref 150–440)
RBC: 4.1 10*6/uL (ref 3.80–5.20)
RDW: 15.9 % — AB (ref 11.5–14.5)
WBC: 2.2 x10 3/mm — ABNORMAL LOW (ref 3.6–11.0)

## 2013-11-24 LAB — COMPREHENSIVE METABOLIC PANEL
ALBUMIN: 3.7 g/dL (ref 3.4–5.0)
ALK PHOS: 50 U/L
ANION GAP: 6 — AB (ref 7–16)
BUN: 16 mg/dL (ref 7–18)
Bilirubin,Total: 0.6 mg/dL (ref 0.2–1.0)
CALCIUM: 9.7 mg/dL (ref 8.5–10.1)
CO2: 32 mmol/L (ref 21–32)
Chloride: 104 mmol/L (ref 98–107)
Creatinine: 0.77 mg/dL (ref 0.60–1.30)
EGFR (African American): >60
Glucose: 142 mg/dL — ABNORMAL HIGH (ref 65–99)
Osmolality: 287 (ref 275–301)
POTASSIUM: 4 mmol/L (ref 3.5–5.1)
SGOT(AST): 30 U/L (ref 15–37)
SGPT (ALT): 71 U/L (ref 12–78)
Sodium: 142 mmol/L (ref 136–145)
Total Protein: 6.2 g/dL — ABNORMAL LOW (ref 6.4–8.2)

## 2013-11-24 LAB — CBC CANCER CENTER
BASOS ABS: 0.1 x10 3/mm (ref 0.0–0.1)
BASOS PCT: 3.4 %
Eosinophil #: 0 x10 3/mm (ref 0.0–0.7)
Eosinophil %: 2.2 %
HCT: 41.5 % (ref 35.0–47.0)
HGB: 13.9 g/dL (ref 12.0–16.0)
LYMPHS ABS: 0.3 x10 3/mm — AB (ref 1.0–3.6)
Lymphocyte %: 15.7 %
MCH: 34 pg (ref 26.0–34.0)
MCHC: 33.5 g/dL (ref 32.0–36.0)
MCV: 102 fL — ABNORMAL HIGH (ref 80–100)
Monocyte #: 0.4 x10 3/mm (ref 0.2–0.9)
Monocyte %: 23.2 %
NEUTROS PCT: 55.5 %
Neutrophil #: 0.9 x10 3/mm — ABNORMAL LOW (ref 1.4–6.5)
Platelet: 190 x10 3/mm (ref 150–440)
RBC: 4.08 10*6/uL (ref 3.80–5.20)
RDW: 15.6 % — AB (ref 11.5–14.5)
WBC: 1.7 x10 3/mm — AB (ref 3.6–11.0)

## 2013-12-01 LAB — CBC CANCER CENTER
BASOS ABS: 0.1 x10 3/mm (ref 0.0–0.1)
Basophil %: 2.1 %
Eosinophil #: 0 x10 3/mm (ref 0.0–0.7)
Eosinophil %: 0.8 %
HCT: 42 % (ref 35.0–47.0)
HGB: 14.1 g/dL (ref 12.0–16.0)
LYMPHS PCT: 18.5 %
Lymphocyte #: 0.8 x10 3/mm — ABNORMAL LOW (ref 1.0–3.6)
MCH: 33.8 pg (ref 26.0–34.0)
MCHC: 33.5 g/dL (ref 32.0–36.0)
MCV: 101 fL — ABNORMAL HIGH (ref 80–100)
Monocyte #: 0.7 x10 3/mm (ref 0.2–0.9)
Monocyte %: 16.4 %
Neutrophil #: 2.7 x10 3/mm (ref 1.4–6.5)
Neutrophil %: 62.2 %
Platelet: 151 x10 3/mm (ref 150–440)
RBC: 4.17 10*6/uL (ref 3.80–5.20)
RDW: 15.7 % — ABNORMAL HIGH (ref 11.5–14.5)
WBC: 4.4 x10 3/mm (ref 3.6–11.0)

## 2013-12-01 LAB — HEPATIC FUNCTION PANEL A (ARMC)
Albumin: 3.8 g/dL (ref 3.4–5.0)
Alkaline Phosphatase: 50 U/L
BILIRUBIN TOTAL: 0.5 mg/dL (ref 0.2–1.0)
Bilirubin, Direct: 0.1 mg/dL (ref 0.00–0.20)
SGOT(AST): 25 U/L (ref 15–37)
SGPT (ALT): 34 U/L (ref 12–78)
TOTAL PROTEIN: 6.5 g/dL (ref 6.4–8.2)

## 2013-12-01 LAB — CREATININE, SERUM
CREATININE: 0.74 mg/dL (ref 0.60–1.30)
EGFR (Non-African Amer.): >60

## 2013-12-03 ENCOUNTER — Ambulatory Visit: Payer: Self-pay | Admitting: Internal Medicine

## 2013-12-08 LAB — CBC CANCER CENTER
Basophil #: 0 x10 3/mm (ref 0.0–0.1)
Basophil %: 0.3 %
Eosinophil #: 0 x10 3/mm (ref 0.0–0.7)
Eosinophil %: 0.2 %
HCT: 40.7 % (ref 35.0–47.0)
HGB: 13.7 g/dL (ref 12.0–16.0)
Lymphocyte #: 0.2 x10 3/mm — ABNORMAL LOW (ref 1.0–3.6)
Lymphocyte %: 4.3 %
MCH: 33.8 pg (ref 26.0–34.0)
MCHC: 33.5 g/dL (ref 32.0–36.0)
MCV: 101 fL — AB (ref 80–100)
Monocyte #: 0.2 x10 3/mm (ref 0.2–0.9)
Monocyte %: 3.1 %
Neutrophil #: 4.7 x10 3/mm (ref 1.4–6.5)
Neutrophil %: 92.1 %
Platelet: 192 x10 3/mm (ref 150–440)
RBC: 4.04 10*6/uL (ref 3.80–5.20)
RDW: 14.9 % — ABNORMAL HIGH (ref 11.5–14.5)
WBC: 5.1 x10 3/mm (ref 3.6–11.0)

## 2013-12-08 LAB — COMPREHENSIVE METABOLIC PANEL
ALBUMIN: 3.9 g/dL (ref 3.4–5.0)
Alkaline Phosphatase: 47 U/L
Anion Gap: 9 (ref 7–16)
BILIRUBIN TOTAL: 1 mg/dL (ref 0.2–1.0)
BUN: 20 mg/dL — ABNORMAL HIGH (ref 7–18)
CREATININE: 0.72 mg/dL (ref 0.60–1.30)
Calcium, Total: 9.9 mg/dL (ref 8.5–10.1)
Chloride: 100 mmol/L (ref 98–107)
Co2: 32 mmol/L (ref 21–32)
GLUCOSE: 117 mg/dL — AB (ref 65–99)
OSMOLALITY: 285 (ref 275–301)
POTASSIUM: 3.8 mmol/L (ref 3.5–5.1)
SGOT(AST): 15 U/L (ref 15–37)
SGPT (ALT): 31 U/L (ref 12–78)
Sodium: 141 mmol/L (ref 136–145)
TOTAL PROTEIN: 6.6 g/dL (ref 6.4–8.2)

## 2013-12-08 LAB — HEPATIC FUNCTION PANEL A (ARMC): Bilirubin, Direct: 0.2 mg/dL (ref 0.00–0.20)

## 2013-12-15 LAB — HEPATIC FUNCTION PANEL A (ARMC)
ALK PHOS: 50 U/L
Albumin: 3.5 g/dL (ref 3.4–5.0)
Bilirubin, Direct: 0.1 mg/dL (ref 0.00–0.20)
Bilirubin,Total: 0.6 mg/dL (ref 0.2–1.0)
SGOT(AST): 25 U/L (ref 15–37)
SGPT (ALT): 43 U/L (ref 12–78)
Total Protein: 6.1 g/dL — ABNORMAL LOW (ref 6.4–8.2)

## 2013-12-15 LAB — CBC CANCER CENTER
BASOS ABS: 0 x10 3/mm (ref 0.0–0.1)
BASOS PCT: 1.6 %
Eosinophil #: 0.1 x10 3/mm (ref 0.0–0.7)
Eosinophil %: 2.8 %
HCT: 38.2 % (ref 35.0–47.0)
HGB: 12.6 g/dL (ref 12.0–16.0)
LYMPHS ABS: 0.3 x10 3/mm — AB (ref 1.0–3.6)
Lymphocyte %: 12 %
MCH: 34.2 pg — ABNORMAL HIGH (ref 26.0–34.0)
MCHC: 33 g/dL (ref 32.0–36.0)
MCV: 104 fL — AB (ref 80–100)
MONO ABS: 0.3 x10 3/mm (ref 0.2–0.9)
Monocyte %: 12.1 %
NEUTROS ABS: 1.5 x10 3/mm (ref 1.4–6.5)
Neutrophil %: 71.5 %
PLATELETS: 165 x10 3/mm (ref 150–440)
RBC: 3.68 10*6/uL — ABNORMAL LOW (ref 3.80–5.20)
RDW: 15.2 % — ABNORMAL HIGH (ref 11.5–14.5)
WBC: 2.1 x10 3/mm — AB (ref 3.6–11.0)

## 2013-12-15 LAB — CREATININE, SERUM
Creatinine: 0.85 mg/dL (ref 0.60–1.30)
EGFR (African American): >60
EGFR (Non-African Amer.): >60

## 2013-12-22 LAB — CBC CANCER CENTER
Basophil #: 0.1 x10 3/mm (ref 0.0–0.1)
Basophil %: 2.6 %
EOS ABS: 0 x10 3/mm (ref 0.0–0.7)
Eosinophil %: 2 %
HCT: 39.2 % (ref 35.0–47.0)
HGB: 13 g/dL (ref 12.0–16.0)
Lymphocyte #: 0.3 x10 3/mm — ABNORMAL LOW (ref 1.0–3.6)
Lymphocyte %: 15.6 %
MCH: 34.3 pg — ABNORMAL HIGH (ref 26.0–34.0)
MCHC: 33.3 g/dL (ref 32.0–36.0)
MCV: 103 fL — AB (ref 80–100)
MONO ABS: 0.4 x10 3/mm (ref 0.2–0.9)
Monocyte %: 19.5 %
Neutrophil #: 1.3 x10 3/mm — ABNORMAL LOW (ref 1.4–6.5)
Neutrophil %: 60.3 %
Platelet: 194 x10 3/mm (ref 150–440)
RBC: 3.81 10*6/uL (ref 3.80–5.20)
RDW: 15.4 % — ABNORMAL HIGH (ref 11.5–14.5)
WBC: 2.2 x10 3/mm — AB (ref 3.6–11.0)

## 2013-12-22 LAB — HEPATIC FUNCTION PANEL A (ARMC)
ALK PHOS: 46 U/L
ALT: 63 U/L (ref 12–78)
AST: 34 U/L (ref 15–37)
Albumin: 3.8 g/dL (ref 3.4–5.0)
BILIRUBIN DIRECT: 0.1 mg/dL (ref 0.00–0.20)
BILIRUBIN TOTAL: 0.5 mg/dL (ref 0.2–1.0)
Total Protein: 6.5 g/dL (ref 6.4–8.2)

## 2013-12-22 LAB — CREATININE, SERUM
Creatinine: 0.76 mg/dL (ref 0.60–1.30)
EGFR (African American): >60
EGFR (Non-African Amer.): >60

## 2013-12-29 LAB — CBC CANCER CENTER
Basophil #: 0 x10 3/mm (ref 0.0–0.1)
Basophil %: 0.8 %
EOS ABS: 0.1 x10 3/mm (ref 0.0–0.7)
Eosinophil %: 1.9 %
HCT: 39.4 % (ref 35.0–47.0)
HGB: 13.2 g/dL (ref 12.0–16.0)
LYMPHS ABS: 0.2 x10 3/mm — AB (ref 1.0–3.6)
LYMPHS PCT: 8.4 %
MCH: 34.4 pg — ABNORMAL HIGH (ref 26.0–34.0)
MCHC: 33.5 g/dL (ref 32.0–36.0)
MCV: 103 fL — AB (ref 80–100)
MONO ABS: 0.3 x10 3/mm (ref 0.2–0.9)
Monocyte %: 10.2 %
NEUTROS PCT: 78.7 %
Neutrophil #: 2.2 x10 3/mm (ref 1.4–6.5)
Platelet: 158 x10 3/mm (ref 150–440)
RBC: 3.84 10*6/uL (ref 3.80–5.20)
RDW: 15.4 % — AB (ref 11.5–14.5)
WBC: 2.8 x10 3/mm — AB (ref 3.6–11.0)

## 2013-12-29 LAB — HEPATIC FUNCTION PANEL A (ARMC)
ALBUMIN: 3.8 g/dL (ref 3.4–5.0)
ALK PHOS: 51 U/L
Bilirubin, Direct: 0.1 mg/dL (ref 0.00–0.20)
Bilirubin,Total: 0.5 mg/dL (ref 0.2–1.0)
SGOT(AST): 46 U/L — ABNORMAL HIGH (ref 15–37)
SGPT (ALT): 98 U/L — ABNORMAL HIGH (ref 12–78)
Total Protein: 6.5 g/dL (ref 6.4–8.2)

## 2013-12-29 LAB — CREATININE, SERUM
CREATININE: 0.71 mg/dL (ref 0.60–1.30)
EGFR (African American): >60
EGFR (Non-African Amer.): >60

## 2013-12-31 LAB — HEPATIC FUNCTION PANEL A (ARMC)
ALBUMIN: 3.5 g/dL (ref 3.4–5.0)
Alkaline Phosphatase: 47 U/L
BILIRUBIN DIRECT: 0.1 mg/dL (ref 0.00–0.20)
Bilirubin,Total: 0.6 mg/dL (ref 0.2–1.0)
SGOT(AST): 28 U/L (ref 15–37)
SGPT (ALT): 61 U/L (ref 12–78)
TOTAL PROTEIN: 6.4 g/dL (ref 6.4–8.2)

## 2014-01-02 ENCOUNTER — Ambulatory Visit: Payer: Self-pay | Admitting: Internal Medicine

## 2014-01-05 LAB — CBC CANCER CENTER
BASOS ABS: 0 x10 3/mm (ref 0.0–0.1)
BASOS PCT: 1.5 %
EOS PCT: 0.7 %
Eosinophil #: 0 x10 3/mm (ref 0.0–0.7)
HCT: 35.5 % (ref 35.0–47.0)
HGB: 12.6 g/dL (ref 12.0–16.0)
Lymphocyte #: 0.1 x10 3/mm — ABNORMAL LOW (ref 1.0–3.6)
Lymphocyte %: 6 %
MCH: 34.8 pg — ABNORMAL HIGH (ref 26.0–34.0)
MCHC: 35.4 g/dL (ref 32.0–36.0)
MCV: 98 fL (ref 80–100)
Monocyte #: 0 x10 3/mm — ABNORMAL LOW (ref 0.2–0.9)
Monocyte %: 2.1 %
NEUTROS ABS: 1.6 x10 3/mm (ref 1.4–6.5)
Neutrophil %: 89.7 %
Platelet: 162 x10 3/mm (ref 150–440)
RBC: 3.61 10*6/uL — ABNORMAL LOW (ref 3.80–5.20)
RDW: 15.5 % — AB (ref 11.5–14.5)
WBC: 1.8 x10 3/mm — AB (ref 3.6–11.0)

## 2014-01-05 LAB — CREATININE, SERUM
Creatinine: 0.59 mg/dL — ABNORMAL LOW (ref 0.60–1.30)
EGFR (Non-African Amer.): >60

## 2014-01-05 LAB — HEPATIC FUNCTION PANEL A (ARMC)
ALBUMIN: 3.5 g/dL (ref 3.4–5.0)
ALT: 59 U/L (ref 12–78)
Alkaline Phosphatase: 48 U/L
Bilirubin, Direct: 0.1 mg/dL (ref 0.00–0.20)
Bilirubin,Total: 0.5 mg/dL (ref 0.2–1.0)
SGOT(AST): 25 U/L (ref 15–37)
TOTAL PROTEIN: 6.3 g/dL — AB (ref 6.4–8.2)

## 2014-01-07 ENCOUNTER — Inpatient Hospital Stay: Payer: Self-pay | Admitting: Internal Medicine

## 2014-01-07 LAB — CBC WITH DIFFERENTIAL/PLATELET
BASOS PCT: 0 %
Basophil #: 0 10*3/uL (ref 0.0–0.1)
Eosinophil #: 0.1 10*3/uL (ref 0.0–0.7)
Eosinophil %: 12.7 %
HCT: 41.1 % (ref 35.0–47.0)
HGB: 13.8 g/dL (ref 12.0–16.0)
Lymphocyte #: 0.2 10*3/uL — ABNORMAL LOW (ref 1.0–3.6)
Lymphocyte %: 25.5 %
MCH: 33.9 pg (ref 26.0–34.0)
MCHC: 33.7 g/dL (ref 32.0–36.0)
MCV: 101 fL — ABNORMAL HIGH (ref 80–100)
MONO ABS: 0.2 x10 3/mm (ref 0.2–0.9)
Monocyte %: 21.8 %
Neutrophil #: 0.3 10*3/uL — ABNORMAL LOW (ref 1.4–6.5)
Neutrophil %: 40 %
Platelet: 190 10*3/uL (ref 150–440)
RBC: 4.08 10*6/uL (ref 3.80–5.20)
RDW: 15.5 % — ABNORMAL HIGH (ref 11.5–14.5)
WBC: 0.8 10*3/uL — CL (ref 3.6–11.0)

## 2014-01-07 LAB — MAGNESIUM: Magnesium: 1.9 mg/dL

## 2014-01-07 LAB — COMPREHENSIVE METABOLIC PANEL
ALBUMIN: 3.5 g/dL (ref 3.4–5.0)
ALK PHOS: 48 U/L
ANION GAP: 9 (ref 7–16)
BUN: 22 mg/dL — ABNORMAL HIGH (ref 7–18)
Bilirubin,Total: 0.9 mg/dL (ref 0.2–1.0)
CREATININE: 1.19 mg/dL (ref 0.60–1.30)
Calcium, Total: 9.4 mg/dL (ref 8.5–10.1)
Chloride: 91 mmol/L — ABNORMAL LOW (ref 98–107)
Co2: 31 mmol/L (ref 21–32)
EGFR (African American): 48 — ABNORMAL LOW
GFR CALC NON AF AMER: 42 — AB
GLUCOSE: 192 mg/dL — AB (ref 65–99)
OSMOLALITY: 271 (ref 275–301)
POTASSIUM: 3.5 mmol/L (ref 3.5–5.1)
SGOT(AST): 71 U/L — ABNORMAL HIGH (ref 15–37)
SGPT (ALT): 55 U/L (ref 12–78)
Sodium: 131 mmol/L — ABNORMAL LOW (ref 136–145)
TOTAL PROTEIN: 6.7 g/dL (ref 6.4–8.2)

## 2014-01-07 LAB — URINALYSIS, COMPLETE
BILIRUBIN, UR: NEGATIVE
Glucose,UR: NEGATIVE mg/dL (ref 0–75)
Leukocyte Esterase: NEGATIVE
NITRITE: NEGATIVE
PH: 5 (ref 4.5–8.0)
Specific Gravity: 1.016 (ref 1.003–1.030)
Squamous Epithelial: NONE SEEN

## 2014-01-07 LAB — TROPONIN I
TROPONIN-I: 0.12 ng/mL — AB
Troponin-I: 0.09 ng/mL — ABNORMAL HIGH
Troponin-I: 0.1 ng/mL — ABNORMAL HIGH

## 2014-01-07 LAB — CK-MB
CK-MB: 16.3 ng/mL — AB (ref 0.5–3.6)
CK-MB: 12 ng/mL — ABNORMAL HIGH (ref 0.5–3.6)

## 2014-01-07 LAB — PROTIME-INR
INR: 1
Prothrombin Time: 13.1 secs (ref 11.5–14.7)

## 2014-01-07 LAB — PHOSPHORUS: Phosphorus: 4.7 mg/dL (ref 2.5–4.9)

## 2014-01-08 LAB — BASIC METABOLIC PANEL
Anion Gap: 6 — ABNORMAL LOW (ref 7–16)
BUN: 12 mg/dL (ref 7–18)
CALCIUM: 8 mg/dL — AB (ref 8.5–10.1)
Chloride: 104 mmol/L (ref 98–107)
Co2: 30 mmol/L (ref 21–32)
Creatinine: 0.79 mg/dL (ref 0.60–1.30)
EGFR (African American): >60
GLUCOSE: 142 mg/dL — AB (ref 65–99)
Osmolality: 282 (ref 275–301)
Potassium: 3 mmol/L — ABNORMAL LOW (ref 3.5–5.1)
Sodium: 140 mmol/L (ref 136–145)

## 2014-01-08 LAB — CBC WITH DIFFERENTIAL/PLATELET
BASOS ABS: 0 10*3/uL (ref 0.0–0.1)
BASOS PCT: 0.3 %
Eosinophil #: 0 10*3/uL (ref 0.0–0.7)
Eosinophil %: 1.9 %
HCT: 34 % — ABNORMAL LOW (ref 35.0–47.0)
HGB: 11.9 g/dL — ABNORMAL LOW (ref 12.0–16.0)
LYMPHS PCT: 18.6 %
Lymphocyte #: 0.1 10*3/uL — ABNORMAL LOW (ref 1.0–3.6)
MCH: 35 pg — AB (ref 26.0–34.0)
MCHC: 35.1 g/dL (ref 32.0–36.0)
MCV: 100 fL (ref 80–100)
Monocyte #: 0 x10 3/mm — ABNORMAL LOW (ref 0.2–0.9)
Monocyte %: 5 %
NEUTROS ABS: 0.4 10*3/uL — AB (ref 1.4–6.5)
NEUTROS PCT: 74.2 %
Platelet: 157 10*3/uL (ref 150–440)
RBC: 3.41 10*6/uL — AB (ref 3.80–5.20)
RDW: 15.5 % — AB (ref 11.5–14.5)
WBC: 0.6 10*3/uL — CL (ref 3.6–11.0)

## 2014-01-08 LAB — URINE CULTURE

## 2014-01-08 LAB — CK-MB: CK-MB: 8.2 ng/mL — AB (ref 0.5–3.6)

## 2014-01-09 LAB — CBC WITH DIFFERENTIAL/PLATELET
Basophil #: 0 10*3/uL (ref 0.0–0.1)
Basophil %: 1.3 %
Eosinophil #: 0 10*3/uL (ref 0.0–0.7)
Eosinophil %: 5 %
HCT: 35.2 % (ref 35.0–47.0)
HGB: 12.4 g/dL (ref 12.0–16.0)
LYMPHS PCT: 23.3 %
Lymphocyte #: 0.1 10*3/uL — ABNORMAL LOW (ref 1.0–3.6)
MCH: 34.6 pg — ABNORMAL HIGH (ref 26.0–34.0)
MCHC: 35.2 g/dL (ref 32.0–36.0)
MCV: 99 fL (ref 80–100)
Monocyte #: 0.1 x10 3/mm — ABNORMAL LOW (ref 0.2–0.9)
Monocyte %: 12.3 %
Neutrophil #: 0.3 10*3/uL — ABNORMAL LOW (ref 1.4–6.5)
Neutrophil %: 58.1 %
PLATELETS: 167 10*3/uL (ref 150–440)
RBC: 3.58 10*6/uL — ABNORMAL LOW (ref 3.80–5.20)
RDW: 15.3 % — ABNORMAL HIGH (ref 11.5–14.5)
WBC: 0.6 10*3/uL — AB (ref 3.6–11.0)

## 2014-01-10 LAB — CBC WITH DIFFERENTIAL/PLATELET
Bands: 2 %
Basophil: 1 %
EOS PCT: 4 %
HCT: 35.2 % (ref 35.0–47.0)
HGB: 11.8 g/dL — ABNORMAL LOW (ref 12.0–16.0)
MCH: 33.6 pg (ref 26.0–34.0)
MCHC: 33.7 g/dL (ref 32.0–36.0)
MCV: 100 fL (ref 80–100)
MONOS PCT: 12 %
Metamyelocyte: 1 %
NRBC/100 WBC: 1 /
Platelet: 196 10*3/uL (ref 150–440)
RBC: 3.53 10*6/uL — ABNORMAL LOW (ref 3.80–5.20)
RDW: 15.4 % — ABNORMAL HIGH (ref 11.5–14.5)
Segmented Neutrophils: 66 %
Variant Lymphocyte - H1-Rlymph: 14 %
WBC: 2.4 10*3/uL — AB (ref 3.6–11.0)

## 2014-01-10 LAB — CREATININE, SERUM
CREATININE: 0.66 mg/dL (ref 0.60–1.30)
EGFR (African American): >60

## 2014-01-10 LAB — BASIC METABOLIC PANEL
Anion Gap: 4 — ABNORMAL LOW (ref 7–16)
BUN: 5 mg/dL — AB (ref 7–18)
CO2: 33 mmol/L — AB (ref 21–32)
Calcium, Total: 8.3 mg/dL — ABNORMAL LOW (ref 8.5–10.1)
Chloride: 102 mmol/L (ref 98–107)
Glucose: 124 mg/dL — ABNORMAL HIGH (ref 65–99)
OSMOLALITY: 276 (ref 275–301)
POTASSIUM: 3 mmol/L — AB (ref 3.5–5.1)
Sodium: 139 mmol/L (ref 136–145)

## 2014-01-10 LAB — POTASSIUM: Potassium: 3.7 mmol/L (ref 3.5–5.1)

## 2014-01-12 ENCOUNTER — Inpatient Hospital Stay: Payer: Self-pay | Admitting: Internal Medicine

## 2014-01-12 LAB — HEPATIC FUNCTION PANEL A (ARMC)
ALT: 154 U/L — AB (ref 12–78)
AST: 105 U/L — AB (ref 15–37)
Albumin: 3.1 g/dL — ABNORMAL LOW (ref 3.4–5.0)
Alkaline Phosphatase: 64 U/L
BILIRUBIN DIRECT: 0.1 mg/dL (ref 0.00–0.20)
BILIRUBIN TOTAL: 0.4 mg/dL (ref 0.2–1.0)
TOTAL PROTEIN: 6.8 g/dL (ref 6.4–8.2)

## 2014-01-12 LAB — CBC CANCER CENTER
BASOS ABS: 0 x10 3/mm (ref 0.0–0.1)
Basophil %: 0.3 %
Eosinophil #: 0 x10 3/mm (ref 0.0–0.7)
Eosinophil %: 0.1 %
HCT: 42.1 % (ref 35.0–47.0)
HGB: 14.5 g/dL (ref 12.0–16.0)
LYMPHS ABS: 0.1 x10 3/mm — AB (ref 1.0–3.6)
Lymphocyte %: 2.6 %
MCH: 34 pg (ref 26.0–34.0)
MCHC: 34.4 g/dL (ref 32.0–36.0)
MCV: 99 fL (ref 80–100)
Monocyte #: 0.9 x10 3/mm (ref 0.2–0.9)
Monocyte %: 18.2 %
NEUTROS PCT: 78.8 %
Neutrophil #: 4.1 x10 3/mm (ref 1.4–6.5)
Platelet: 268 x10 3/mm (ref 150–440)
RBC: 4.26 10*6/uL (ref 3.80–5.20)
RDW: 15.8 % — AB (ref 11.5–14.5)
WBC: 5.2 x10 3/mm (ref 3.6–11.0)

## 2014-01-12 LAB — CULTURE, BLOOD (SINGLE)

## 2014-01-12 LAB — CREATININE, SERUM
CREATININE: 0.89 mg/dL (ref 0.60–1.30)
GFR CALC NON AF AMER: 59 — AB

## 2014-01-12 LAB — MAGNESIUM: MAGNESIUM: 1.9 mg/dL

## 2014-01-12 LAB — POTASSIUM: Potassium: 4.3 mmol/L (ref 3.5–5.1)

## 2014-01-13 LAB — CBC WITH DIFFERENTIAL/PLATELET
BASOS ABS: 0 10*3/uL (ref 0.0–0.1)
Basophil %: 0.7 %
EOS ABS: 0 10*3/uL (ref 0.0–0.7)
Eosinophil %: 0 %
HCT: 36.7 % (ref 35.0–47.0)
HGB: 12.5 g/dL (ref 12.0–16.0)
Lymphocyte #: 0.2 10*3/uL — ABNORMAL LOW (ref 1.0–3.6)
Lymphocyte %: 8.4 %
MCH: 34.3 pg — AB (ref 26.0–34.0)
MCHC: 33.9 g/dL (ref 32.0–36.0)
MCV: 101 fL — ABNORMAL HIGH (ref 80–100)
MONO ABS: 0.5 x10 3/mm (ref 0.2–0.9)
MONOS PCT: 22.7 %
NEUTROS ABS: 1.5 10*3/uL (ref 1.4–6.5)
NEUTROS PCT: 68.2 %
Platelet: 191 10*3/uL (ref 150–440)
RBC: 3.63 10*6/uL — ABNORMAL LOW (ref 3.80–5.20)
RDW: 16.3 % — ABNORMAL HIGH (ref 11.5–14.5)
WBC: 2.1 10*3/uL — ABNORMAL LOW (ref 3.6–11.0)

## 2014-01-13 LAB — COMPREHENSIVE METABOLIC PANEL
ALT: 101 U/L — AB (ref 12–78)
AST: 90 U/L — AB (ref 15–37)
Albumin: 2.2 g/dL — ABNORMAL LOW (ref 3.4–5.0)
Alkaline Phosphatase: 40 U/L — ABNORMAL LOW
Anion Gap: 6 — ABNORMAL LOW (ref 7–16)
BILIRUBIN TOTAL: 0.4 mg/dL (ref 0.2–1.0)
BUN: 14 mg/dL (ref 7–18)
Calcium, Total: 8 mg/dL — ABNORMAL LOW (ref 8.5–10.1)
Chloride: 97 mmol/L — ABNORMAL LOW (ref 98–107)
Co2: 29 mmol/L (ref 21–32)
Creatinine: 0.82 mg/dL (ref 0.60–1.30)
EGFR (African American): >60
EGFR (Non-African Amer.): >60
GLUCOSE: 199 mg/dL — AB (ref 65–99)
Osmolality: 271 (ref 275–301)
POTASSIUM: 3.4 mmol/L — AB (ref 3.5–5.1)
Sodium: 132 mmol/L — ABNORMAL LOW (ref 136–145)
TOTAL PROTEIN: 5.3 g/dL — AB (ref 6.4–8.2)

## 2014-01-14 LAB — BASIC METABOLIC PANEL
ANION GAP: 4 — AB (ref 7–16)
BUN: 12 mg/dL (ref 7–18)
CALCIUM: 7.6 mg/dL — AB (ref 8.5–10.1)
CO2: 30 mmol/L (ref 21–32)
Chloride: 101 mmol/L (ref 98–107)
Creatinine: 0.77 mg/dL (ref 0.60–1.30)
EGFR (African American): >60
Glucose: 166 mg/dL — ABNORMAL HIGH (ref 65–99)
Osmolality: 274 (ref 275–301)
Potassium: 3.6 mmol/L (ref 3.5–5.1)
Sodium: 135 mmol/L — ABNORMAL LOW (ref 136–145)

## 2014-01-14 LAB — CBC WITH DIFFERENTIAL/PLATELET
Basophil #: 0 10*3/uL (ref 0.0–0.1)
Basophil %: 0.2 %
Eosinophil #: 0 10*3/uL (ref 0.0–0.7)
Eosinophil %: 0.1 %
HCT: 31.5 % — ABNORMAL LOW (ref 35.0–47.0)
HGB: 11 g/dL — AB (ref 12.0–16.0)
LYMPHS ABS: 0.2 10*3/uL — AB (ref 1.0–3.6)
LYMPHS PCT: 2.6 %
MCH: 34.9 pg — AB (ref 26.0–34.0)
MCHC: 34.9 g/dL (ref 32.0–36.0)
MCV: 100 fL (ref 80–100)
MONOS PCT: 4.2 %
Monocyte #: 0.4 x10 3/mm (ref 0.2–0.9)
Neutrophil #: 8.4 10*3/uL — ABNORMAL HIGH (ref 1.4–6.5)
Neutrophil %: 92.9 %
Platelet: 168 10*3/uL (ref 150–440)
RBC: 3.15 10*6/uL — ABNORMAL LOW (ref 3.80–5.20)
RDW: 15.8 % — ABNORMAL HIGH (ref 11.5–14.5)
WBC: 9 10*3/uL (ref 3.6–11.0)

## 2014-01-15 LAB — CBC WITH DIFFERENTIAL/PLATELET
BASOS ABS: 0 10*3/uL (ref 0.0–0.1)
BASOS PCT: 0.3 %
EOS PCT: 0.5 %
Eosinophil #: 0 10*3/uL (ref 0.0–0.7)
HCT: 30 % — AB (ref 35.0–47.0)
HGB: 10.3 g/dL — ABNORMAL LOW (ref 12.0–16.0)
LYMPHS ABS: 0.2 10*3/uL — AB (ref 1.0–3.6)
Lymphocyte %: 4.1 %
MCH: 34.5 pg — ABNORMAL HIGH (ref 26.0–34.0)
MCHC: 34.4 g/dL (ref 32.0–36.0)
MCV: 100 fL (ref 80–100)
MONO ABS: 0.3 x10 3/mm (ref 0.2–0.9)
Monocyte %: 5.5 %
Neutrophil #: 5.2 10*3/uL (ref 1.4–6.5)
Neutrophil %: 89.6 %
Platelet: 184 10*3/uL (ref 150–440)
RBC: 2.99 10*6/uL — ABNORMAL LOW (ref 3.80–5.20)
RDW: 16.1 % — ABNORMAL HIGH (ref 11.5–14.5)
WBC: 5.9 10*3/uL (ref 3.6–11.0)

## 2014-01-15 LAB — CREATININE, SERUM
Creatinine: 0.68 mg/dL (ref 0.60–1.30)
EGFR (African American): >60
EGFR (Non-African Amer.): >60

## 2014-01-15 LAB — URINE CULTURE

## 2014-01-15 LAB — SGOT (AST)(ARMC): SGOT(AST): 82 U/L — ABNORMAL HIGH (ref 15–37)

## 2014-01-16 LAB — CBC WITH DIFFERENTIAL/PLATELET
Basophil #: 0 10*3/uL (ref 0.0–0.1)
Basophil %: 0.9 %
Eosinophil #: 0 10*3/uL (ref 0.0–0.7)
Eosinophil %: 1.1 %
HCT: 29.6 % — AB (ref 35.0–47.0)
HGB: 10.1 g/dL — AB (ref 12.0–16.0)
LYMPHS ABS: 0.2 10*3/uL — AB (ref 1.0–3.6)
LYMPHS PCT: 7.8 %
MCH: 34.4 pg — ABNORMAL HIGH (ref 26.0–34.0)
MCHC: 34.2 g/dL (ref 32.0–36.0)
MCV: 101 fL — ABNORMAL HIGH (ref 80–100)
MONO ABS: 0.4 x10 3/mm (ref 0.2–0.9)
MONOS PCT: 13.3 %
NEUTROS ABS: 2.1 10*3/uL (ref 1.4–6.5)
Neutrophil %: 76.9 %
Platelet: 221 10*3/uL (ref 150–440)
RBC: 2.95 10*6/uL — ABNORMAL LOW (ref 3.80–5.20)
RDW: 15.9 % — ABNORMAL HIGH (ref 11.5–14.5)
WBC: 2.8 10*3/uL — ABNORMAL LOW (ref 3.6–11.0)

## 2014-01-16 LAB — BASIC METABOLIC PANEL
ANION GAP: 3 — AB (ref 7–16)
BUN: 7 mg/dL (ref 7–18)
CHLORIDE: 106 mmol/L (ref 98–107)
CO2: 31 mmol/L (ref 21–32)
Calcium, Total: 8 mg/dL — ABNORMAL LOW (ref 8.5–10.1)
Creatinine: 0.7 mg/dL (ref 0.60–1.30)
EGFR (African American): >60
EGFR (Non-African Amer.): >60
Glucose: 143 mg/dL — ABNORMAL HIGH (ref 65–99)
Osmolality: 280 (ref 275–301)
Potassium: 3.5 mmol/L (ref 3.5–5.1)
SODIUM: 140 mmol/L (ref 136–145)

## 2014-01-16 LAB — SGOT (AST)(ARMC): AST: 73 U/L — AB (ref 15–37)

## 2014-01-17 LAB — VANCOMYCIN, TROUGH: Vancomycin, Trough: 6 ug/mL — ABNORMAL LOW (ref 10–20)

## 2014-01-17 LAB — CULTURE, BLOOD (SINGLE)

## 2014-01-18 LAB — COMPREHENSIVE METABOLIC PANEL
ALT: 66 U/L (ref 12–78)
AST: 42 U/L — AB (ref 15–37)
Albumin: 2.3 g/dL — ABNORMAL LOW (ref 3.4–5.0)
Alkaline Phosphatase: 50 U/L
Anion Gap: 5 — ABNORMAL LOW (ref 7–16)
BUN: 5 mg/dL — ABNORMAL LOW (ref 7–18)
Bilirubin,Total: 0.5 mg/dL (ref 0.2–1.0)
CALCIUM: 8.8 mg/dL (ref 8.5–10.1)
CREATININE: 0.72 mg/dL (ref 0.60–1.30)
Chloride: 103 mmol/L (ref 98–107)
Co2: 33 mmol/L — ABNORMAL HIGH (ref 21–32)
EGFR (African American): >60
EGFR (Non-African Amer.): >60
Glucose: 138 mg/dL — ABNORMAL HIGH (ref 65–99)
Osmolality: 281 (ref 275–301)
Potassium: 3.1 mmol/L — ABNORMAL LOW (ref 3.5–5.1)
SODIUM: 141 mmol/L (ref 136–145)
TOTAL PROTEIN: 5.4 g/dL — AB (ref 6.4–8.2)

## 2014-01-18 LAB — VANCOMYCIN, TROUGH: Vancomycin, Trough: 14 ug/mL (ref 10–20)

## 2014-01-18 LAB — CULTURE, BLOOD (SINGLE)

## 2014-01-18 LAB — CBC WITH DIFFERENTIAL/PLATELET
BANDS NEUTROPHIL: 1 %
Basophil: 2 %
Eosinophil: 5 %
HCT: 34.5 % — AB (ref 35.0–47.0)
HGB: 11.6 g/dL — AB (ref 12.0–16.0)
LYMPHS PCT: 17 %
MCH: 33.9 pg (ref 26.0–34.0)
MCHC: 33.5 g/dL (ref 32.0–36.0)
MCV: 101 fL — ABNORMAL HIGH (ref 80–100)
Metamyelocyte: 1 %
Monocytes: 21 %
Platelet: 315 10*3/uL (ref 150–440)
RBC: 3.41 10*6/uL — AB (ref 3.80–5.20)
RDW: 16 % — AB (ref 11.5–14.5)
Segmented Neutrophils: 53 %
WBC: 3 10*3/uL — ABNORMAL LOW (ref 3.6–11.0)

## 2014-01-19 LAB — CBC WITH DIFFERENTIAL/PLATELET
BASOS ABS: 1 %
Bands: 4 %
EOS PCT: 2 %
HCT: 35.2 % (ref 35.0–47.0)
HGB: 12 g/dL (ref 12.0–16.0)
Lymphocytes: 12 %
MCH: 34.5 pg — ABNORMAL HIGH (ref 26.0–34.0)
MCHC: 34.1 g/dL (ref 32.0–36.0)
MCV: 101 fL — ABNORMAL HIGH (ref 80–100)
METAMYELOCYTE: 1 %
Monocytes: 19 %
NRBC/100 WBC: 1 /
PLATELETS: 359 10*3/uL (ref 150–440)
RBC: 3.48 10*6/uL — ABNORMAL LOW (ref 3.80–5.20)
RDW: 16.8 % — ABNORMAL HIGH (ref 11.5–14.5)
Segmented Neutrophils: 61 %
WBC: 2.8 10*3/uL — AB (ref 3.6–11.0)

## 2014-01-19 LAB — CREATININE, SERUM
Creatinine: 0.8 mg/dL (ref 0.60–1.30)
EGFR (African American): >60

## 2014-01-19 LAB — POTASSIUM: Potassium: 3.1 mmol/L — ABNORMAL LOW (ref 3.5–5.1)

## 2014-01-19 LAB — MAGNESIUM: MAGNESIUM: 1.9 mg/dL

## 2014-01-20 LAB — CBC WITH DIFFERENTIAL/PLATELET
Basophil: 2 %
Comment - H1-Com1: NORMAL
Eosinophil: 4 %
HCT: 35.4 % (ref 35.0–47.0)
HGB: 11.8 g/dL — AB (ref 12.0–16.0)
LYMPHS PCT: 20 %
MCH: 33.9 pg (ref 26.0–34.0)
MCHC: 33.2 g/dL (ref 32.0–36.0)
MCV: 102 fL — ABNORMAL HIGH (ref 80–100)
METAMYELOCYTE: 1 %
MONOS PCT: 10 %
Myelocyte: 2 %
PLATELETS: 387 10*3/uL (ref 150–440)
RBC: 3.47 10*6/uL — ABNORMAL LOW (ref 3.80–5.20)
RDW: 16.4 % — AB (ref 11.5–14.5)
SEGMENTED NEUTROPHILS: 57 %
Variant Lymphocyte - H1-Rlymph: 4 %
WBC: 3.4 10*3/uL — AB (ref 3.6–11.0)

## 2014-01-20 LAB — CREATININE, SERUM
CREATININE: 0.63 mg/dL (ref 0.60–1.30)
EGFR (Non-African Amer.): >60

## 2014-01-20 LAB — POTASSIUM: POTASSIUM: 3.3 mmol/L — AB (ref 3.5–5.1)

## 2014-02-02 ENCOUNTER — Ambulatory Visit: Payer: Self-pay | Admitting: Internal Medicine

## 2014-04-13 ENCOUNTER — Ambulatory Visit: Payer: Self-pay | Admitting: Internal Medicine

## 2014-04-13 LAB — COMPREHENSIVE METABOLIC PANEL
ALK PHOS: 53 U/L
ANION GAP: 4 — AB (ref 7–16)
Albumin: 3.2 g/dL — ABNORMAL LOW (ref 3.4–5.0)
BUN: 11 mg/dL (ref 7–18)
Bilirubin,Total: 0.3 mg/dL (ref 0.2–1.0)
CO2: 33 mmol/L — AB (ref 21–32)
Calcium, Total: 9.5 mg/dL (ref 8.5–10.1)
Chloride: 103 mmol/L (ref 98–107)
Creatinine: 0.91 mg/dL (ref 0.60–1.30)
EGFR (Non-African Amer.): 58 — ABNORMAL LOW
Glucose: 128 mg/dL — ABNORMAL HIGH (ref 65–99)
OSMOLALITY: 280 (ref 275–301)
Potassium: 4.1 mmol/L (ref 3.5–5.1)
SGOT(AST): 15 U/L (ref 15–37)
SGPT (ALT): 17 U/L
SODIUM: 140 mmol/L (ref 136–145)
Total Protein: 6.7 g/dL (ref 6.4–8.2)

## 2014-04-13 LAB — CBC CANCER CENTER
BASOS ABS: 0 x10 3/mm (ref 0.0–0.1)
Basophil %: 0.6 %
Eosinophil #: 0.3 x10 3/mm (ref 0.0–0.7)
Eosinophil %: 7 %
HCT: 40.2 % (ref 35.0–47.0)
HGB: 13.1 g/dL (ref 12.0–16.0)
LYMPHS PCT: 10.6 %
Lymphocyte #: 0.5 x10 3/mm — ABNORMAL LOW (ref 1.0–3.6)
MCH: 29.4 pg (ref 26.0–34.0)
MCHC: 32.6 g/dL (ref 32.0–36.0)
MCV: 90 fL (ref 80–100)
Monocyte #: 0.4 x10 3/mm (ref 0.2–0.9)
Monocyte %: 9.7 %
NEUTROS ABS: 3.2 x10 3/mm (ref 1.4–6.5)
NEUTROS PCT: 72.1 %
PLATELETS: 220 x10 3/mm (ref 150–440)
RBC: 4.44 10*6/uL (ref 3.80–5.20)
RDW: 14.2 % (ref 11.5–14.5)
WBC: 4.5 x10 3/mm (ref 3.6–11.0)

## 2014-04-27 LAB — CBC CANCER CENTER
BASOS ABS: 0 x10 3/mm (ref 0.0–0.1)
BASOS PCT: 0.8 %
EOS PCT: 4.1 %
Eosinophil #: 0.2 x10 3/mm (ref 0.0–0.7)
HCT: 43.8 % (ref 35.0–47.0)
HGB: 14 g/dL (ref 12.0–16.0)
LYMPHS PCT: 10.1 %
Lymphocyte #: 0.5 x10 3/mm — ABNORMAL LOW (ref 1.0–3.6)
MCH: 29 pg (ref 26.0–34.0)
MCHC: 32 g/dL (ref 32.0–36.0)
MCV: 91 fL (ref 80–100)
MONO ABS: 0.3 x10 3/mm (ref 0.2–0.9)
MONOS PCT: 6.1 %
NEUTROS ABS: 3.7 x10 3/mm (ref 1.4–6.5)
NEUTROS PCT: 78.9 %
Platelet: 142 x10 3/mm — ABNORMAL LOW (ref 150–440)
RBC: 4.84 10*6/uL (ref 3.80–5.20)
RDW: 14.7 % — ABNORMAL HIGH (ref 11.5–14.5)
WBC: 4.7 x10 3/mm (ref 3.6–11.0)

## 2014-04-28 ENCOUNTER — Ambulatory Visit: Payer: Self-pay | Admitting: Internal Medicine

## 2014-04-30 LAB — CBC CANCER CENTER
BASOS ABS: 0 x10 3/mm (ref 0.0–0.1)
BASOS PCT: 0.7 %
EOS ABS: 0.3 x10 3/mm (ref 0.0–0.7)
Eosinophil %: 5.2 %
HCT: 45.4 % (ref 35.0–47.0)
HGB: 14.9 g/dL (ref 12.0–16.0)
LYMPHS ABS: 0.8 x10 3/mm — AB (ref 1.0–3.6)
Lymphocyte %: 13.3 %
MCH: 29.3 pg (ref 26.0–34.0)
MCHC: 32.7 g/dL (ref 32.0–36.0)
MCV: 90 fL (ref 80–100)
Monocyte #: 0.3 x10 3/mm (ref 0.2–0.9)
Monocyte %: 5.7 %
Neutrophil #: 4.6 x10 3/mm (ref 1.4–6.5)
Neutrophil %: 75.1 %
Platelet: 141 x10 3/mm — ABNORMAL LOW (ref 150–440)
RBC: 5.06 10*6/uL (ref 3.80–5.20)
RDW: 14.8 % — AB (ref 11.5–14.5)
WBC: 6.1 x10 3/mm (ref 3.6–11.0)

## 2014-05-04 LAB — CBC CANCER CENTER
Basophil #: 0 x10 3/mm (ref 0.0–0.1)
Basophil %: 0.6 %
EOS ABS: 0.3 x10 3/mm (ref 0.0–0.7)
EOS PCT: 5.3 %
HCT: 44.5 % (ref 35.0–47.0)
HGB: 14.5 g/dL (ref 12.0–16.0)
LYMPHS ABS: 0.7 x10 3/mm — AB (ref 1.0–3.6)
Lymphocyte %: 15.1 %
MCH: 29.1 pg (ref 26.0–34.0)
MCHC: 32.6 g/dL (ref 32.0–36.0)
MCV: 89 fL (ref 80–100)
Monocyte #: 0.3 x10 3/mm (ref 0.2–0.9)
Monocyte %: 6.4 %
NEUTROS ABS: 3.6 x10 3/mm (ref 1.4–6.5)
Neutrophil %: 72.6 %
Platelet: 122 x10 3/mm — ABNORMAL LOW (ref 150–440)
RBC: 4.98 10*6/uL (ref 3.80–5.20)
RDW: 14.9 % — ABNORMAL HIGH (ref 11.5–14.5)
WBC: 4.9 x10 3/mm (ref 3.6–11.0)

## 2014-05-05 ENCOUNTER — Ambulatory Visit: Payer: Self-pay | Admitting: Internal Medicine

## 2014-05-13 ENCOUNTER — Ambulatory Visit: Payer: Self-pay | Admitting: Internal Medicine

## 2014-05-13 LAB — CBC CANCER CENTER
BASOS PCT: 0.4 %
Basophil #: 0 x10 3/mm (ref 0.0–0.1)
Eosinophil #: 0.2 x10 3/mm (ref 0.0–0.7)
Eosinophil %: 3.9 %
HCT: 42.2 % (ref 35.0–47.0)
HGB: 13.8 g/dL (ref 12.0–16.0)
Lymphocyte #: 1.1 x10 3/mm (ref 1.0–3.6)
Lymphocyte %: 19.8 %
MCH: 28.5 pg (ref 26.0–34.0)
MCHC: 32.8 g/dL (ref 32.0–36.0)
MCV: 87 fL (ref 80–100)
Monocyte #: 0.4 x10 3/mm (ref 0.2–0.9)
Monocyte %: 6.3 %
NEUTROS ABS: 3.9 x10 3/mm (ref 1.4–6.5)
Neutrophil %: 69.6 %
Platelet: 97 x10 3/mm — ABNORMAL LOW (ref 150–440)
RBC: 4.85 10*6/uL (ref 3.80–5.20)
RDW: 14.7 % — ABNORMAL HIGH (ref 11.5–14.5)
WBC: 5.7 x10 3/mm (ref 3.6–11.0)

## 2014-05-13 LAB — HEPATIC FUNCTION PANEL A (ARMC)
ALBUMIN: 3.2 g/dL — AB (ref 3.4–5.0)
ALK PHOS: 55 U/L
AST: 18 U/L (ref 15–37)
BILIRUBIN TOTAL: 0.4 mg/dL (ref 0.2–1.0)
SGPT (ALT): 23 U/L
Total Protein: 7.1 g/dL (ref 6.4–8.2)

## 2014-05-13 LAB — URINALYSIS, COMPLETE
BILIRUBIN, UR: NEGATIVE
GLUCOSE, UR: NEGATIVE mg/dL (ref 0–75)
Hyaline Cast: 7
Ketone: NEGATIVE
NITRITE: NEGATIVE
PH: 6 (ref 4.5–8.0)
PROTEIN: NEGATIVE
RBC,UR: 28 /HPF (ref 0–5)
Specific Gravity: 1.014 (ref 1.003–1.030)
WBC UR: 316 /HPF (ref 0–5)

## 2014-05-13 LAB — CREATININE, SERUM
Creatinine: 1.02 mg/dL (ref 0.60–1.30)
GFR CALC AF AMER: 58 — AB
GFR CALC NON AF AMER: 50 — AB

## 2014-05-13 LAB — LACTATE DEHYDROGENASE: LDH: 264 U/L — AB (ref 81–246)

## 2014-05-15 ENCOUNTER — Emergency Department: Payer: Self-pay | Admitting: Emergency Medicine

## 2014-05-15 LAB — CBC WITH DIFFERENTIAL/PLATELET
BANDS NEUTROPHIL: 5 %
Comment - H1-Com2: NORMAL
Eosinophil: 3 %
HCT: 40.3 % (ref 35.0–47.0)
HGB: 13.5 g/dL (ref 12.0–16.0)
LYMPHS PCT: 21 %
MCH: 29.1 pg (ref 26.0–34.0)
MCHC: 33.6 g/dL (ref 32.0–36.0)
MCV: 87 fL (ref 80–100)
Metamyelocyte: 4 %
Monocytes: 5 %
Myelocyte: 1 %
PLATELETS: 78 10*3/uL — AB (ref 150–440)
RBC: 4.65 10*6/uL (ref 3.80–5.20)
RDW: 15.2 % — ABNORMAL HIGH (ref 11.5–14.5)
Segmented Neutrophils: 61 %
WBC: 5 10*3/uL (ref 3.6–11.0)

## 2014-05-15 LAB — COMPREHENSIVE METABOLIC PANEL
ALT: 15 U/L
AST: 18 U/L (ref 15–37)
Albumin: 3.2 g/dL — ABNORMAL LOW (ref 3.4–5.0)
Alkaline Phosphatase: 54 U/L
Anion Gap: 6 — ABNORMAL LOW (ref 7–16)
BUN: 16 mg/dL (ref 7–18)
Bilirubin,Total: 0.4 mg/dL (ref 0.2–1.0)
CHLORIDE: 100 mmol/L (ref 98–107)
Calcium, Total: 9.9 mg/dL (ref 8.5–10.1)
Co2: 33 mmol/L — ABNORMAL HIGH (ref 21–32)
Creatinine: 0.85 mg/dL (ref 0.60–1.30)
Glucose: 102 mg/dL — ABNORMAL HIGH (ref 65–99)
OSMOLALITY: 279 (ref 275–301)
Potassium: 3.9 mmol/L (ref 3.5–5.1)
Sodium: 139 mmol/L (ref 136–145)
Total Protein: 7.2 g/dL (ref 6.4–8.2)

## 2014-05-15 LAB — URINE CULTURE

## 2014-05-20 LAB — CULTURE, BLOOD (SINGLE)

## 2014-06-04 ENCOUNTER — Ambulatory Visit: Payer: Self-pay | Admitting: Internal Medicine

## 2014-06-04 DEATH — deceased

## 2014-12-25 NOTE — Consult Note (Signed)
Brief Consult Note: Diagnosis: pancytopenia, suspicious for acute leukemia, uti.   Patient was seen by consultant.   Discussed with Attending MD.   Comments: SEE DICTATED NOTE TO FOLLOW   PANCYTOPENIA, ENLARGED SPLEEN, RECENT WT LOSS, RAPID/FEW WEAKS, ONSET OF PROGRESSIVE WEAKNES. PETECCIAE NOTED 10 DAYS AGO, NO BLEEDING. IN AUG, EXCISION MELANOMA CHEEK AND SKIN GRAFT.INFECTED GRAFT 10 DAYS KEFLEX, THEN SITE SWELLING SO 4 DAYS BACTRIM, D/C 1 WEEK AGO FOR DIARRHEA. LAST 3 DAYS INTERMIYTENT CONFUSION, WEAKNESS, SOBOE, DIZZINESS. UTI DOCUMENTED, CIPRO STARTED YESTERDAY. CBC ABNORMAL YESTERDAY..IMP MOST LIKELY ACUTE LEUKEMIA, POSSIBLE MDS OR MYELFIBROSIS MUCH LESS LIKELY, SMEAR NOT CONSISTENT,  NO EVIDENCE VIRAL OR SEPSIS, WRONG TIME COURSE FOR DRUG, ALSO BLASTS ON SMEAR AND SPLENOMEGALY .LUNGS, ABDO,BENGN, SPLENOMEGALY .PLAN TRANSFUSE PRN IRRADIATED PRODUCTS. IF SPIKES FEVER THEN VANCO AND CEFAPIME.  CHECK URIC ACID. PLAN BM EXAM MEDICAL CENTER OPINION FOR TX OPTIONS., WILL ARRANGE AS IN OR OUT PATIENT.  Electronic Signatures: Marin RobertsGittin, Robert G (MD)  (Signed 14-Oct-14 21:26)  Authored: Brief Consult Note   Last Updated: 14-Oct-14 21:26 by Marin RobertsGittin, Robert G (MD)

## 2014-12-25 NOTE — Consult Note (Signed)
PATIENT NAME:  Sharon Suarez, Sharon Suarez MR#:  [id number] DATE OF BIRTH:  1926-09-07  DATE OF CONSULTATION:  06/17/2013  REFERRING PHYSICIAN:   CONSULTING PHYSICIAN:  Knute Neu. Lorre Nick, MD  HISTORY OF PRESENT ILLNESS: [name] is an 81 year old patient, who presented and was admitted on 10/14. She was seen in the Emergency Room by a hospitalist and has been uncomfortable on the floor when I evaluated her in the early evening. Her primary physician is Dr. Juel Burrow. Her recent includes removal of a melanoma from the face in August, and then a Mohs surgery, and than a skin graft from the right upper chest. That graft site was apparently infected and was treated with Keflex, of course, in late August early September. More recently, it looked like irritation infection of the area, so the patient was given Bactrim. He took 4 days of Bactrim, but had diarrhea and stopped. This was a week ago. The patient noted and the family noted the onset of some confused thinking and some increased general weakness starting on approximately 10/10. On 10/13, she presented to the doctor and had evidence of urine infection with abnormal UA and was started on Cipro. Labs were done and when they were available the next day, the patient was notified of critical results and sent to the hospital. Those results showed hemoglobin of 5 and platelet count was 6000. The patient presented to the Emergency Room and was found to have similar abnormal labs with white count of 2900 total with some immature cells and less than 500 neutrophils. Hemoglobin was 5.8, platelet count was 6000. The patient did not have any fever, chills, or sweats. She did indicate that she had some bruising and it was noted to be petechiae the patient said that was present for about 10 days. No melena. No hematochezia. No hematuria. No epistaxis. No gum bleeding. No headache. No dizziness. The patient has had increased shortness of breath on exertion gradually steadily probably over  a few weeks and some general weakness over 3 to 4 weeks. She did have some orthostasis and had had a 15 to 20 pound weight loss since approximately August.   REVIEW OF SYSTEMS: Additional system review was negative for visual disturbances, headache, ear or jaw pain, neck pain. No palpitations,  or chest pain. No abdominal pain, nausea, vomiting. Brief diarrhea from Bactrim, which resolved. No current hematuria or dysuria. No hot or cold intolerance, polyuria or polydipsia. There was no previous history of being anemic or having any blood disorder. No rash. No bone or joint pain or history of arthritis. No history of depression or anxiety.   PAST MEDICAL HISTORY:  1. She has a melanoma of the face.  2.  Hypertension.   ALLERGIES: None. but SULFA DRUGS RECENTLY CAUSED DIARRHEA.   SOCIAL HISTORY: No alcohol, no tobacco. Lives at home by herself with her daughter, who lives nearby. Mother died from a brain tumor.   MEDICATIONS: The patient is on amlodipine 10 mg daily along with Cipro for 1 day at 250 b.i.d.   PHYSICAL EXAMINATION:  GENERAL: The patient was alert and cooperative.  HEENT: She had pallor. When I saw her, she had received 1 unit of blood and 1 unit of platelets and was receiving a second unit of blood. Sclerae no jaundice. Mouth: No thrush.  NECK: No mass in the neck. No palpable nodes in the neck, supraclavicular, submandibular, or axilla.  LUNGS: Clear. No wheezing, rales.  ABDOMEN: Nontender. No palpable mass. Spleen was palpably enlarged,  not tender. No edema. There was scant bruising on the pretibial area and there was some petechiae in the lower extremities. There was no mucous membrane bleeding.  NEUROLOGIC: Grossly nonfocal. Alert and cooperative. She was oriented. Speech was normal. SKIN: There is evidence of an excision of a skin graft on the left face and there is a lesion area of healing graft with some exuberant granulation bearing tissue of the right upper chest with no  surrounding cellulitis. It is clean, not weeping.  LABS: Creatinine was 1.08, sodium 138,  LDH was 420. Liver functions were normal. Albumin was 3.4. Hemoglobin was 5.5, white count was 2900, platelets are 6000. The differential showed less than 300 absolute neutrophils, some lymphs and a few immature cells and was reported 12% other cells apparently somewhat prominent nucleoli suspicious for blasts.   IMPRESSION AND PLAN: The patient with pancytopenia and appears to be acute leukemia given the large spleen and the appearance of, what looks like, blasts plus the absence of any significant history, this could be MDS, no other history of bleeding, bruising or illness, though has not had a blood count a long time. There is no recent obvious bacterial or viral syndrome. The only new recent medicine was Bactrim, which can acutely cause cytopenia, but could not cause a rapid anemia in the absence of hemolysis or bleeding. 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 The patient has already had some and is to complete other transfusions and would make they were irradiated products. There is a very benign medical history.   PLAN: The patient is completing the transfusion. She received repeated uric acid and was to be checked later. It was okay. [facility], had extensive discussion with the patient and the daughter about getting a Medical Center opinion because there are some potential less aggressive treatments with particular treatments for some leukemic subtypes including particularly the pro-myelocytic leukemia. Diagnosis is to be confirmed by bone marrow, which is [facility] is going to be transferred so I made arrangements in the morning [facility] would [facility]. Continue Cipro. Antibiotics p.r.n. if the patient spiked a fever.  ____________________________ Knute Neuobert G. Lorre NickGittin, MD rgg:aw D: 06/18/2013  12:35:19 ET T: 06/18/2013 13:47:48 ET JOB#: [id number]  cc: Knute Neuobert G. Lorre NickGittin, MD, <Dictator> Marin RobertsOBERT G Chelsae Zanella MD ELECTRONICALLY SIGNED 07/23/2013 12:25

## 2014-12-25 NOTE — H&P (Signed)
PATIENT NAME:  Sharon Suarez, Sharon Suarez MR#:  [id number] DATE OF BIRTH:  02-17-1927  DATE OF ADMISSION:  06/17/2013  PRIMARY CARE PHYSICIAN: Dr. Lavera Guise.   CHIEF COMPLAINT: Generalized weakness, lethargy and dizziness.   HISTORY OF PRESENT ILLNESS: This is an 81 year old female, who presents to the hospital due to significant abnormal lab values with severe anemia and thrombocytopenia and also leukopenia. The patient apparently was recently diagnosed with a urinary tract infection at her primary care physician's office. The patient was started on oral ciprofloxacin yesterday and also had blood work drawn at Dr. Jennette Kettle office. Dr. Lavera Guise after finding the blood work, urgently called her and told her to come to the ER for further evaluation. In the Emergency Room here, the patient was noted to be severely anemic with a hemoglobin of 5 and a platelet count of 6000. Hospitalist services were contacted for treatment and evaluation. The patient does say that she has developed these lower extremity petechiae now for the past 10 days. She denies any significant bruising for any reason. She denies any melena, hematochezia any hematuria or any other associated bleeding presently. She does admit to exertional shortness of breath, but no chest pain. Positive dizziness. No syncope. She also admits to about a 20 pound weight loss since August of this year.   REVIEW OF SYSTEMS: CONSTITUTIONAL: No documented fever. Positive 20 pound weight loss since August. Positive generalized weakness. EYES: No blurry or double vision. ENT: No tinnitus or postnasal drip. No redness of the oropharynx. RESPIRATORY: No cough, no wheeze and no hemoptysis. Positive dyspnea on exertion. CARDIOVASCULAR: No chest pain, no orthopnea, no palpitations, no syncope. GASTROINTESTINAL: No nausea, no vomiting, no diarrhea. No abdominal pain. No melena or hematochezia. GENITOURINARY: No dysuria or hematuria. ENDOCRINE: No polyuria, nocturia or heat or cold  intolerance.  HEMATOLOGIC: Positive anemia. Positive petechiae. Positive easy bruising. INTEGUMENTARY: No rashes. No lesions. MUSCULOSKELETAL: No arthritis, no swelling, no gout. NEUROLOGIC: No numbness or tingling. No ataxia. No seizure activity.  PSYCHIATRIC: No anxiety. No insomnia. No ADD.   PAST MEDICAL HISTORY: Consistent with history of melanoma on her face, status post excision, history of hypertension and history of recent urinary tract infection.   ALLERGIES: SULFA DRUGS, WHICH CAUSES NAUSEA.   SOCIAL HISTORY: No smoking. No alcohol abuse. No illicit drug abuse. Lives at home by herself.   FAMILY HISTORY: Both mother and father are deceased. Mother died from a brain tumor. Father died from heart disease.   CURRENT MEDICATIONS: As follows: Amlodipine 10 mg daily and ciprofloxacin 250 mg b.i.d.   PHYSICAL EXAMINATION:  VITAL SIGNS: Temperature is 98.1, pulse 92, respirations 20, blood pressure 134/63, sats 100% on room air.  GENERAL: She is a pale-appearing female, slightly lethargic, but in no apparent distress.  HEAD, EYES, EARS, NOSE, THROAT: The patient is atraumatic, normocephalic. Her extraocular muscles are intact. Her pupils are equal and reactive to light. Sclerae anicteric. No conjunctival injection. No oropharyngeal erythema.  NECK: Supple. There is no jugular venous distention. No bruits, no lymphadenopathy, no thyromegaly.  HEART: Regular rate and rhythm. No murmurs. No rubs or clicks.  LUNGS: Clear to auscultation bilaterally. No rales. No rhonchi, no wheezes.  ABDOMEN: Soft, flat, nontender, nondistended. She has positive splenomegaly, but no rebound and no rigidity. Good bowel sounds.  EXTREMITIES: No evidence of any cyanosis, clubbing, or peripheral edema. Has +2 pedal and radial pulses bilaterally.  NEUROLOGICAL: The patient is alert, awake, and oriented x 3 with no focal motor or sensory deficits appreciated  bilaterally.  SKIN: Moist, warm. She does have a petechial  rash on her lower extremities bilaterally.  LYMPHATIC: There is no cervical or axillary lymphadenopathy.   LABORATORY DATA: Serum glucose of 131, BUN 20, creatinine 1.08, sodium 138, potassium 4, chloride 106, bicarbonate 26. The patient's LDH is 420, otherwise LFTs are within normal limits with an albumin of 3.4. White cell count 3, hematocrit 5.5, hematocrit 15.2, platelet count of 6. Manual diff is still pending.   ASSESSMENT AND PLAN: This is an 81 year old female with a history of hypertension, history of melanoma, status post excision, recent urinary tract infection, who presents to the hospital due to lethargy, weakness and dizziness, noted to have acute pancytopenia.  1.  Anemia/thrombocytopenia and leukopenia. The exact etiology of this is unclear presently, but suspicious for possible acute leukemia. Unlikely this is idiopathic thrombocytopenia purpura given the fact that all cell lines are down presently. The patient likely will need a bone marrow biopsy. I discussed the case with Dr. Inez Pilgrim, who plans performing this after the patient gets some supportive care with packed red blood cells and platelets. Her platelets and red blood cells will be eradiated. We will follow serial counts and follow her clinically.  2.  Generalized weakness. This secondary to the pancytopenia. We will follow her clinically after she gets transfused.  3.  Hypertension. The patient is presently hemodynamically stable. Continue with her Norvasc.  4.  History of recent urinary tract infection. Continue ciprofloxacin. 5.  History of malignant melanoma. This is in remission. Continue further care as per oncology.   CODE STATUS: The patient is a full code.   TIME SPENT ON ADMISSION: 50 minutes.   ____________________________ Belia Heman. Verdell Carmine, MD vjs:aw D: 06/17/2013 13:44:49 ET T: 06/17/2013 14:25:55 ET JOB#: [id number]  cc: Belia Heman. Verdell Carmine, MD, <Dictator> Henreitta Leber MD ELECTRONICALLY SIGNED 07/02/2013  14:30

## 2014-12-25 NOTE — Discharge Summary (Signed)
PATIENT NAME:  Sharon Suarez, Sharon Suarez MR#:  [id number] DATE OF BIRTH:  [birth year]  DATE OF ADMISSION:  06/17/2013 DATE OF TRANSFER: 06/18/2013  ADMITTING PHYSICIAN: Dr. Abel Presto.   DISCHARGING PHYSICIAN: Gladstone Lighter, MD   ACCEPTING FACILITY: [facility].   CONSULTATIONS IN THE HOSPITAL:  1. Oncology consultation by Dr. Inez Pilgrim.  2. Primary care physician is Dr. Cletis Athens.  DISCHARGE DIAGNOSES: 1.  Acute pancytopenia likely questionable acute leukemia, will need bone marrow biopsy.  2.  Hypertension.  3.  A history of melanoma status post excision.  4.  Recent urinary tract infection on antibiotics.   CURRENT MEDICATIONS:  1. Amlodipine 10 mg p.o. daily.  2. Ciprofloxacin 250 mg p.o. b.i.d., started on 06/17/2013.  3. Ensure 240 mL chocolate flavor twice a day with meals.  4. Tylenol 650 mg q. 4 hours p.r.n. for pain or fever.   DISCHARGE DIET: Low-sodium diet.   DISCHARGE ACTIVITY: As tolerated.    FOLLOWUP INSTRUCTIONS: The patient is being transferred to [facility] for further diagnosis and treatment.   LABORATORY AND STUDIES PRIOR TO DISCHARGE: WBC 2.9, hemoglobin 8.4, hematocrit 23.6, platelet count is 31. This was after the patient got transfusion of 2 units of packed RBC and 1 unit of platelets. LDH is elevated at 420. Hemolysis workup revealed a normal haptoglobin at 164. INR 1.1. Prothrombin time is 14.7.   Magnesium 1.8.  Hemoglobin on admission is 5.5, hematocrit 15.2, platelet count was 6000, uric acid 6.1.   Sodium 140, potassium 4.0, chloride 108, bicarbonate 26, BUN 15, creatinine 0.92, glucose 109 and calcium of 8.9. CT head without contrast showing chronic involutional changes without evidence of acute abnormalities. Urinalysis negative for any infection.   BRIEF HOSPITAL COURSE: Sharon Suarez is an 81 year old, elderly, Caucasian female with past medical history only significant for hypertension, presented to the hospital due to abnormal blood work  that was done at PCPs office on 06/16/2013. The patient's last blood work was almost 2 years ago for routine visit. Was doing fine up until three days ago when she started to have more weakness and dizziness and was seen by Dr. Lavera Guise. Blood work and urine tests were done. She was diagnosed with urinary tract infection and was discharged on Cipro at the time, but she was called again yesterday on 06/17/2013 saying that her blood counts were extremely low and she needed to go to the Emergency Room. Her hemoglobin in the ER was 5.5 and platelet count was 6. That prompted admission to the hospital. The patient denies any spontaneous bleeding, other than weakness, dizziness. she does not have any specific symptoms.   1. Acute pancytopenia. Her white count is also low along with platelets and hemoglobin, and there were some abnormal possible blast-like cells seen on the peripheral smear.  She was seen by hematologist Dr. Inez Pilgrim in the hospital and she needs a bone marrow biopsy. But because there is concern for acute leukemia speeding up the diagnosis and treatment will help, so Dr. Inez Pilgrim has spoken with Dr. Kalman Shan at [facility] and the patient was accepted for transfer to [facility]. So, the patient will be transferred whenever a bed is available. She will need a bone marrow biopsy, and further diagnostics and treatment per the hematologist. The patient has also noted 20 pound weight loss for the last couple of months and has significant splenomegaly on exam. She did receive 2 units of packed RBC transfusion and 1 unit platelet transfusion in the hospital.  2. Hypertension.  Norvasc is being continued.  3. Melanoma of the skin status post resection on her left cheek.  4. Urinary tract infection. She is on Cipro, started on 06/17/2013 and will continue that for at least five days. Her repeat urine here shows no infections.   Her course has been otherwise uneventful in the hospital.   DISCHARGE CONDITION: Guarded.   DISCHARGE  DISPOSITION: To [facility].   TIME SPENT ON DISCHARGE: 45 minutes.    ____________________________ Gladstone Lighter, MD rk:sg D: 06/18/2013 14:01:00 ET T: 06/18/2013 14:20:01 ET JOB#: [id number]  cc: Simonne Come. Inez Pilgrim, MD Gladstone Lighter, MD, <Dictator>     Gladstone Lighter MD ELECTRONICALLY SIGNED 06/21/2013 12:09

## 2014-12-26 IMAGING — CR DG CHEST 1V PORT
1 series · 1 of 1 positions shown · non-contrast
Comparison: 01/07/2014

CLINICAL DATA: Weakness and fever

EXAM:
PORTABLE CHEST - 1 VIEW

[ap]
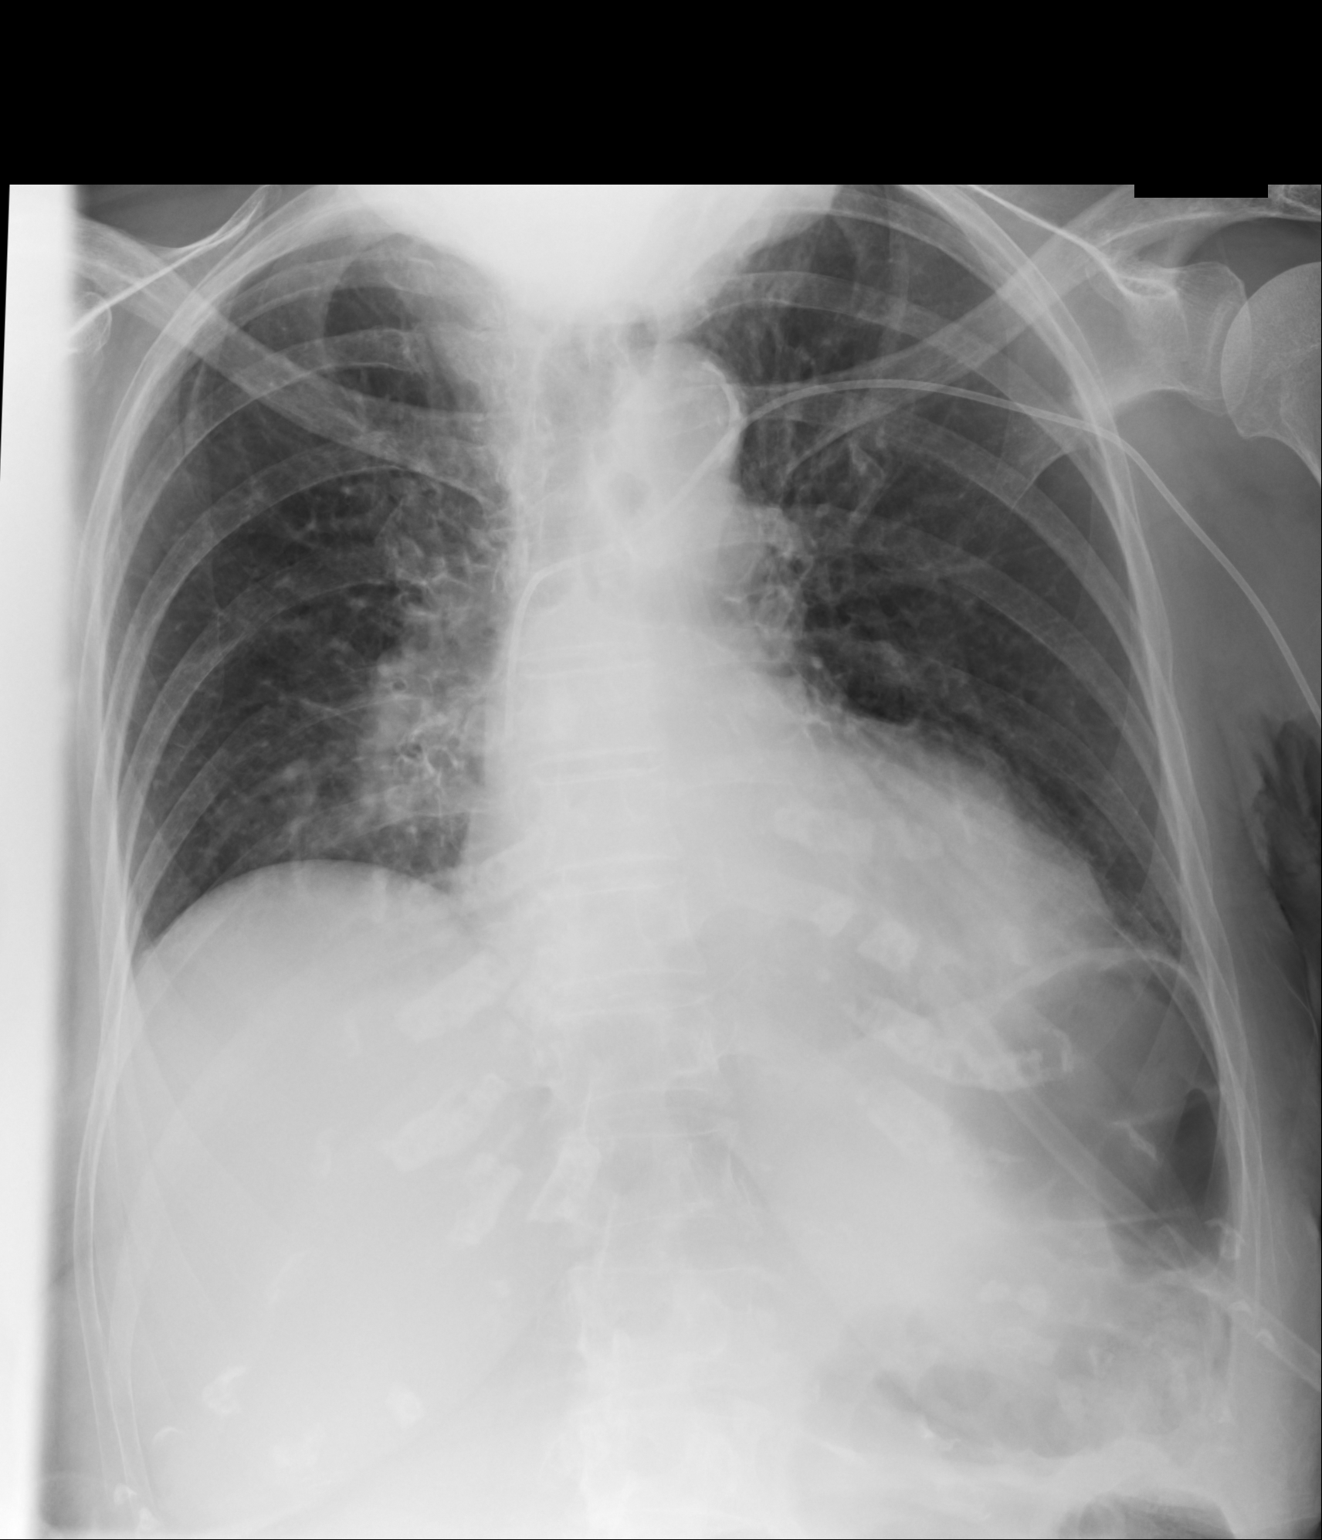

[1 of 1 positions shown; findings below may reference images not displayed]

FINDINGS: Cardiac shadow remains enlarged. A left-sided PICC line is again
identified and stable. The lungs are well aerated bilaterally no
focal confluent infiltrate is seen. No bony abnormalities noted.
IMPRESSION: No acute abnormality seen.

## 2014-12-26 NOTE — Consult Note (Signed)
Brief Consult Note: Diagnosis: ALL IN REMISSION, NEURTROPENIA, SIRS.   Patient was seen by consultant.   Discussed with Attending MD.   Comments: SEEN EARLIER IN EVENING, SEE DICTATED NOTE, ADMITTED ONE DAY WEAKNESS, NO FEVER OR CHILLS, NEXT DAY, TODAY, WEAKER, OBTUNDED, SEEN IN ER, FLUIDS GIVEN, BP MEDS ADJUSTED, BROAD SPECTRUM ABS STARTED, CULTURES. CXR AND URINE CLEAR, BLOOD CULTURES PENDING. WHEN SEEN WAS ALERT, COOPERATIVE, ORIENTED X 3, GENERAL WEAKNES, NOT ACUTELY ILL OR TOXIC APPEARING, LATER LOW GRADE FEVER, LUNGS CLEAR ABDO BENIGN,  PLAN NO ROLE FOR FLAGYL, CONTINUE VANCO AND FORTAZ, IF DECLINES CHANGE TO CEFHIPIME OR PRIMAXIN. STARTED NEUPOGEN. HOLD 6MP. HAS ALREADY TAKEN MTX WEEKLY DOSE YESTERDAY.  Electronic Signatures: Marin RobertsGittin, Robert G (MD)  (Signed 06-May-15 22:57)  Authored: Brief Consult Note   Last Updated: 06-May-15 22:57 by Marin RobertsGittin, Robert G (MD)

## 2014-12-26 NOTE — H&P (Signed)
PATIENT NAME:  Sharon Suarez, Sharon Suarez MR#:  [id number] DATE OF BIRTH:  1927-08-30  DATE OF ADMISSION:  01/12/2014  HISTORY OF PRESENT ILLNESS:  The patient is an 81 year old patient who was [facility] and [facility] had a [facility].  The patient has a past history of lymphoblastic lymphoma, previously treated in remission and on maintenance therapy with 6-mercaptopurine and methotrexate and monthly prednisone and vincristine. She was also on Valtrex prophylaxis. The patient has regular followup in the Cancer Center and was recently hospitalized for neutropenic fever but was discharged 48 hours ago with neutrophils improving and has been afebrile in 48 hours. The patient went home on Augmentin. Her 6-mercaptopurine was on hold.  Her methotrexate due weekly on Tuesdays was on hold or p.r.n.  She was on Zofran p.r.n., Zantac 75 mg daily, Valtrex 500 mg daily, Norvasc 10 mg daily, Colace p.r.n., Senna p.r.n.   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   FAMILY HISTORY: Noncontributory.   SOCIAL HISTORY: Noncontributory.   ALLERGIES: SULFA DRUGS.  MEDICATIONS: As above.   SYSTEM REVIEW: The patient was this morning found to be more lethargic. She was fine last night. She came in the clinic. She was alert and oriented, generally weak, and spiked a fever of over 103. She had no headache, was not dizzy. No retrosternal chest pain. She had some shortness of breath. No chest pain. No cough. No wheezing. No hemoptysis. No epistaxis. No abdominal pain. No nausea, vomiting, diarrhea.  Did not have any chills. No focal weakness. No numbness of any of the extremities. General weakness. No rash or bruising.   PHYSICAL EXAMINATION:  GENERAL: Pallor, alert, cooperative, anxious, looks subacutely and acutely ill. HEENT: Sclerae, no jaundice.   MOUTH: No thrush.  LUNGS: Decreased air with some bibasilar fine rales.  HEART: Regular.   ABDOMEN: Nontender. No palpable mass or organomegaly.  EXTREMITIES: No extremity edema. The PICC line site in the left arm is unremarkable.  LYMPH: No palpable lymph nodes in the neck. No rash. No bruising.  ABDOMEN: Abdomen was not tender.   LABORATORY DATA:  The potassium was 4.3.  The magnesium was 1.9.  The bilirubin was 0.4. The alkaline phosphatase was 64. The SGPT was 154. The SGOT was 105.  The creatinine was 0.89.  The white count was 5.2 and neutrophils 4.1, hemoglobin 14.5, platelets 268,000.   IMPRESSION: The patient with fever. She was recently neutropenic but had not been neutropenic for a few days. No obvious source of infection, very fine basal rales or rhonchi. Could be a lung source, atelectasis and pneumonia, just recently been in the hospital. She has a PICC line that can be infected. No obvious viral syndrome. No respiratory flulike symptoms. Liver functions have also jumped.  The liver enzymes are elevated.  The hemoglobin is up compared to a few days ago, so it looks like there is some hemoconcentration and dehydration. The patient was hypoxic when she spiked a fever. No report initially of blasts on the CBC so leukemia appears to still be in remission.   SUMMARY AND PLAN: Fever, source unknown, not neutropenia but recently neutropenic, immunocompromised. Hypoxic. Liver functions have jumped. We will hospitalize. We will get 2 blood cultures done in the clinic, one from each port.  She started Cefepime and vancomycin empirically. Oxygen p.r.n. Get a chest x-ray. Just antibiotics p.r.n. for clinical situation or cultures. Watch the electrolytes. Give low to moderate volume fluids  initially. Watch the blood pressure. Continue Valtrex, hold Norvasc, give p.r.n. Treat constipation p.r.n.    ____________________________ Knute Neu. Lorre Nick, MD rgg:dd D: 01/12/2014 17:50:02 ET T: 01/12/2014 18:17:18 ET JOB#: [id number]  cc: Knute Neu. Lorre Nick, MD, <Dictator> Marin Roberts  MD ELECTRONICALLY SIGNED 01/28/2014 11:48

## 2014-12-26 NOTE — Discharge Summary (Signed)
Dates of Admission and Diagnosis:  Date of Admission 07-Jan-2014   Date of Discharge 10-Jan-2014   Admitting Diagnosis SIRS   Final Diagnosis SIRS likely viral syndrome. Leukopenia- on emperic Abx- given neupogen- count came up. Hypokalemia- replacing Acute leukemia- following with Dr. Inez Pilgrim.    Chief Complaint/History of Present Illness HISTORY OF PRESENT ILLNESS: The patient is an 81 year old white female with history of lymphoblastic lymphoma, who is being followed by Dr. Inez Pilgrim and is receiving IV vincristine drip at the end of the month; last received it on April 29. The patient normally is active and does well according to her granddaughter, who is at the bedside. The patient was doing well up until yesterday when she started becoming very weak and tired and fatigued and also was feeling hot. The patient earlier today got more lethargic and she could not get up. She was very weak and slow to respond; therefore, brought to the ED. The patient currently in the ED was noted to have tachycardia, was noted to have a lactic acid that was elevated and neutropenic. Her evaluation for infection so far has been nonrevealing including a negative UA and a chest x-ray. The patient currently is very sleepy and is not able to give me any further history.   Allergies:  Sulfa drugs: GI Distress  Hepatic:  06-May-15 11:00   Bilirubin, Total 0.9  Alkaline Phosphatase 48 (45-117 NOTE: New Reference Range 07/25/13)  SGPT (ALT) 55  SGOT (AST)  71  Total Protein, Serum 6.7  Albumin, Serum 3.5  Routine Micro:  06-May-15 11:00   Micro Text Report BLOOD CULTURE   COMMENT                   NO GROWTH AEROBICALLY/ANAEROBICALLY IN 5 DAYS   ANTIBIOTIC                       Culture Comment NO GROWTH AEROBICALLY/ANAEROBICALLY IN 5 DAYS  Result(s) reported on 12 Jan 2014 at 11:00AM.    11:13   Micro Text Report BLOOD CULTURE   COMMENT                   NO GROWTH AEROBICALLY/ANAEROBICALLY IN 5 DAYS    ANTIBIOTIC                       Micro Text Report URINE CULTURE   ORGANISM 1                7000 CFU/ML GRAM POSITIVE COCCI   ANTIBIOTIC                       Lab:  06-May-15 11:00   Lactic Acid, Cardiopulmonary  2.4 (Result(s) reported on 07 Jan 2014 at 11:06AM.)  Routine Chem:  06-May-15 11:00   Result Comment AUTO DIFF - ORDER CANCELLED, PATIENT ALREADY HAD A  - CBC RAN WHICH INCLUDES AN AUTO DIFF.  - C/ LAUREN SKELLY. 01/07/14 AT [address]  Result(s) reported on 07 Jan 2014 at 01:51PM.  Potassium, Serum 3.5  Glucose, Serum  192  BUN  22  Sodium, Serum  131  Chloride, Serum  91  CO2, Serum 31  Calcium (Total), Serum 9.4  Anion Gap 9  Osmolality (calc) 271  Creatinine (comp) 1.19  eGFR (African American)  48  eGFR (Non-African American)  42 (eGFR values <60m/min/1.73 m2 may be an indication of chronic kidney disease (CKD). Calculated eGFR is  useful in patients with stable renal function. The eGFR calculation will not be reliable in acutely ill patients when serum creatinine is changing rapidly. It is not useful in  patients on dialysis. The eGFR calculation may not be applicable to patients at the low and high extremes of body sizes, pregnant women, and vegetarians.)  Magnesium, Serum 1.9 (1.8-2.4 THERAPEUTIC RANGE: 4-7 mg/dL TOXIC: > 10 mg/dL  -----------------------)  Phosphorus, Serum 4.7 (Result(s) reported on 07 Jan 2014 at 11:48AM.)  07-May-15 02:02   Potassium, Serum  3.0  09-May-15 04:25   Potassium, Serum  3.0    13:54   Potassium, Serum 3.7  Cardiac:  06-May-15 11:00   Troponin I  0.09 (0.00-0.05 0.05 ng/mL or less: NEGATIVE  Repeat testing in 3-6 hrs  if clinically indicated. >0.05 ng/mL: POTENTIAL  MYOCARDIAL INJURY. Repeat  testing in 3-6 hrs if  clinically indicated. NOTE: An increase or decrease  of 30% or more on serial  testing suggests a  clinically important change)    15:33   Troponin I  0.12 (0.00-0.05 0.05 ng/mL or less:  NEGATIVE  Repeat testing in 3-6 hrs  if clinically indicated. >0.05 ng/mL: POTENTIAL  MYOCARDIAL INJURY. Repeat  testing in 3-6 hrs if  clinically indicated. NOTE: An increase or decrease  of 30% or more on serial  testing suggests a  clinically important change)    22:08   Troponin I  0.10 (0.00-0.05 0.05 ng/mL or less: NEGATIVE  Repeat testing in 3-6 hrs  if clinically indicated. >0.05 ng/mL: POTENTIAL  MYOCARDIAL INJURY. Repeat  testing in 3-6 hrs if  clinically indicated. NOTE: An increase or decrease  of 30% or more on serial  testing suggests a  clinically important change)  Routine Coag:  06-May-15 11:00   Prothrombin 13.1  INR 1.0 (INR reference interval applies to patients on anticoagulant therapy. A single INR therapeutic range for coumarins is not optimal for all indications; however, the suggested range for most indications is 2.0 - 3.0. Exceptions to the INR Reference Range may include: Prosthetic heart valves, acute myocardial infarction, prevention of myocardial infarction, and combinations of aspirin and anticoagulant. The need for a higher or lower target INR must be assessed individually. Reference: The Pharmacology and Management of the Vitamin K  antagonists: the seventh ACCP Conference on Antithrombotic and Thrombolytic Therapy. NLZJQ.7341 Sept:126 (3suppl): [id number]. A HCT value >55% may artifactually increase the PT.  In one study,  the increase was an average of 25%. Reference:  "Effect on Routine and Special Coagulation Testing Values of Citrate Anticoagulant Adjustment in Patients with High HCT Values." American Journal of Clinical Pathology 2006;126:400-405.)  Routine Hem:  06-May-15 11:00   WBC (CBC)  0.8  RBC (CBC) 4.08  Hemoglobin (CBC) 13.8  Hematocrit (CBC) 41.1  Platelet Count (CBC) 190  MCV  101  MCH 33.9  MCHC 33.7  RDW  15.5  Bands -  Segmented Neutrophils -  Variant Lymphocytes -  Monocytes -  Eosinophil -  Basophil -   Metamyelocyte -  NRBC -  Diff Comment 1 -  Diff Comment 2 -  Diff Comment 3 -  Diff Comment 4 -  Neutrophil % -  Lymphocyte % -  Monocyte % -  Eosinophil % -  Basophil % -  Neutrophil # -  Lymphocyte # -  Monocyte # -  Eosinophil # -  Basophil # -  Reference Accession# -  Lymphocytes -  Myelocyte -  Promyelocyte -  Blast-Like -  Other Cells -  Diff Comment 5 -  Diff Comment 6 -  Diff Comment 7 -  Diff Comment 8 -  Diff Comment 9 -  Diff Comment 10 - (Result(s) reported on 07 Jan 2014 at 01:51PM.)  07-May-15 02:02   WBC (CBC)  0.6  08-May-15 05:04   WBC (CBC)  0.6  09-May-15 04:25   WBC (CBC)  2.4   PERTINENT RADIOLOGY STUDIES: XRay:    11-May-15 18:08, Chest Portable Single View  Chest Portable Single View   REASON FOR EXAM:    FEVER, HYPOXIA  COMMENTS:       PROCEDURE: DXR - DXR PORTABLE CHEST SINGLE VIEW  - Jan 12 2014  6:08PM     CLINICAL DATA:  Weakness and fever    EXAM:  PORTABLE CHEST - 1 VIEW    COMPARISON:  01/07/2014    FINDINGS:  Cardiac shadow remains enlarged. A left-sided PICC line is again  identified and stable. The lungs are well aerated bilaterally no  focal confluent infiltrate is seen. No bony abnormalities noted.     IMPRESSION:  No acute abnormality seen.      Electronically Signed    By: Inez Catalina M.D.    On: 01/12/2014 18:09         Verified By: Everlene Farrier, M.D.,   Pertinent Past History:  Pertinent Past History 1.  Significant for ALL, who has been followed by Dr. Inez Pilgrim and has been receiving vincristine. Has had received platelet transfusions and blood transfusions in the past.  2.  History of excision of melanoma of the face and skin with subsequent graft.   PAST SURGICAL HISTORY:  1.  Melanoma removal from the face and a subsequent skin graft.  2.  Status post foot surgery. 3.  Status post tonsillectomy.   Hospital Course:  Hospital Course * Possible sirs syndrome based on her tachycardia and altered  mental status. leukopenia at this time, due to immunocompromised state. on Zosyn. negative cultures.   may be it was due to viral episode?  remains afebrile and cx negative- spoke to Dr. Inez Pilgrim- give oral augmentin for 5 days and d/c. *  Leukopenia. Oncology consult for further recommendations. given Neupogen. follow daily. now came up. havge appointment in 3 days. *  Hypertension.  held amlodipine. remained stable. * History of ALL.stopped mercaptopurine and methotrexate as taken previously. Appreciated Oncology consult. follow in clinic.   Condition on Discharge Stable   Code Status:  Code Status Full Code   DISCHARGE INSTRUCTIONS HOME MEDS:  Medication Reconciliation: Patient's Home Medications at Discharge:     Medication Instructions  valtrex 500 mg oral tablet  1 tab(s) orally once a day   colace  100 milligram(s) orally 2 times a day, As Needed   senna 8.6 mg oral tablet  2 tab(s) orally once a day (at bedtime), As Needed - for Constipation   zantac 75 75 mg oral tablet  1 tab(s) orally once a day   zofran odt 4 mg oral tablet, disintegrating  1 tab(s) orally 3 times a day, As Needed - for Nausea, Vomiting #12   potassium chloride 20 meq oral tablet, extended release  1 tab(s) orally once a day   ensure *  240 milliliter(s) orally 2 times a day   amoxicillin-clavulanate 875 mg-125 mg oral tablet  1 tab(s) orally every 12 hours x 5 days    STOP TAKING THE FOLLOWING MEDICATION(S):    norvasc 10 mg oral tablet: 1 tab(s) orally once a  day miralax - oral powder for reconstitution: 17 gram(s) orally 2 times a day, As Needed - for Constipation risperal: 0.5  orally once a day (at bedtime) methotrexate 2.5 mg oral tablet: 8 tab(s) orally once a week mercaptopurine 50 mg oral tablet: 0.75 tab(s) orally once a day, 37.5 mg prednisone 20 mg oral tablet: 4 tab(s) orally once a day  Physician's Instructions:  Home Health? Yes   Home Health Service Physicial Therapy   Diet Regular    Dietary Supplements Ensure   Dietary Supplements Frequency Two times per day   Activity Limitations As tolerated   Return to Work Not Applicable   Time frame for Follow Up Appointment 1-2 days  Dr. Inez Pilgrim on monday.   Electronic Signatures: Vaughan Basta (MD)  (Signed 13-May-15 14:38)  Authored: ADMISSION DATE AND DIAGNOSIS, CHIEF COMPLAINT/HPI, Allergies, PERTINENT LABS, PERTINENT RADIOLOGY STUDIES, PERTINENT PAST HISTORY, HOSPITAL COURSE, DISCHARGE INSTRUCTIONS HOME MEDS, PATIENT INSTRUCTIONS   Last Updated: 13-May-15 14:38 by Vaughan Basta (MD)

## 2014-12-26 NOTE — Discharge Summary (Signed)
PATIENT NAME:  Sharon Suarez, Sharon Suarez MR#:  [id number] DATE OF BIRTH:  Jul 15, 1927  DATE OF ADMISSION:  01/12/2014 DATE OF DISCHARGE:  01/20/2014   FINAL DIAGNOSES:  1. Systemic immune response syndrome.  2. Neutropenic fever.  3. Pneumonia with evidence of aspiration  4. Underlying acute lymphocytic leukemia, in remission.  5. Neutropenia associated with prior chemotherapy.   HISTORY AND PHYSICAL: Dictated on admission.   HOSPITAL COURSE: The patient was admitted, given fluid support, Tylenol. Electrolytes were monitored. Empiric antibiotics initially with IV vancomycin and cefepime. Had infectious disease consultation. Cultures were done. Later, antibiotic coverage was changed, added Zosyn, discontinued cefepime and added doxycycline. Later, doxycycline changed to p.o., and Zosyn and vancomycin stopped, and p.o. Augmentin given. Neupogen was given a few doses daily to support neutropenia and then discontinued when neutrophil count recovered. The patient's 6-MP and methotrexate chemotherapies were held. The patient was given Xanax for anxiety and sundowning and was given Risperdal every night. Physical therapy was called later for mobility and re-ambulation. First, chest x-rays were done for hypoxia. Oxygen support given. Later, CT done that revealed pneumonia. SVN treatments p.r.n. were given and withdrawn when oxygenation improved. CBC as well as electrolytes were monitored. On May 19, the patient was, and had been for a few days maintaining oxygen saturations back on room air. Had been mobilized, has been up. Intravenous fluids discontinued a number of days ago. Tolerating diet with supplements. Electrolytes had been replaced, IV and p.o. Creatinine was normal, potassium was 3.2. The white count was 3.4, neutrophils were approximately 1800, hemoglobin 11.8, platelets 387. The patient had remained alert and cooperative, with clear lungs, benign abdomen, no edema, PICC site unremarkable, neuro nonfocal. Had  been steadily more alert.   PLANS: To go into SNF for short-term rehab. Physical therapy to follow and ambulate and strengthen.   MEDICATIONS: To continue with:  1. Risperdal 0.5 mg at night 1 tablet. 2. Valtrex 500 mg daily. 3. Ativan 0.25 mg every 4 hours p.r.n. 4. Doxycycline 100 mg tonight and then tomorrow and then stop.  5. Augmentin 500 mg once at night and then in the morning May 20th and then discontinue. 6. Line flushes with saline and heparin each lumen once a day with PICC line maintenance. 7. Potassium 20 mEq extended release restart 1 daily.  8. Zantac 75 mg p.o. daily restart 1 daily. 9. Colace stool softeners restart 100 mg 1 p.o. b.i.d.  10. SVN p.r.n. for wheezing or shortness of breath.   FOLLOWUP: [facility] followup on Friday, May 22nd. Will be holding/not giving methotrexate or 6-mercaptopurine. Later, re-evaluate for treatment at reduced doses for maintenance therapy of underlying leukemia.  ____________________________ Knute Neuobert G. Lorre NickGittin, MD rgg:lb D: 01/20/2014 10:49:52 ET T: 01/20/2014 11:11:25 ET JOB#: [id number]  cc: Knute Neuobert G. Lorre NickGittin, MD, <Dictator> Marin RobertsOBERT G GITTIN MD ELECTRONICALLY SIGNED 01/28/2014 11:48

## 2014-12-26 NOTE — Consult Note (Signed)
PATIENT NAME:  Sharon Suarez, TARKOWSKI MR#:  [id number] DATE OF BIRTH:  [birth year]  DATE OF CONSULTATION:  01/13/2014  REFERRING PHYSICIAN:  Knute Neu. Lorre Nick, MD CONSULTING PHYSICIAN:  Stann Mainland. Sampson Goon, MD  REASON FOR CONSULTATION: Neutropenic fever.   HISTORY OF PRESENT ILLNESS: This is a pleasant 81 year old female with a history of acute lymphoblastic anemia diagnosed initially in October 2014. She has been followed by Dr. Lorre Nick and receives vincristine treatment as well as 6-mercaptopurine and weekly methotrexate. She was also on suppressive daily Valtrex prophylaxis. She was initially admitted May 5 through May 9 with neutropenia and SIRS as well as altered mental status. During that time, she was treated with vancomycin and Zosyn, but all cultures were negative and she was discharged on Augmentin. Per her daughter, who is in the room with her, she had improved and after discharge was able to be more mobile and interactive. However, she then developed worsening fevers and was readmitted May 11. Per her daughter, the main other symptom is confusion and fatigue. She has not had any cough productive of sputum, but does have a mild lingering cough. She has not had any abdominal pain or dysuria, but has recently had some diarrhea that the daughter associates with the Augmentin she has been taking. She has not been noted to have any rash, any sacral ulcers, joint swelling. She has been very confused and having twitches of her upper and lower extremities.   [place], [place], and turkeys. The patient also walks quite a bit in the woods with her dogs. She has not been noted any tick bites recently, however.   PAST MEDICAL HISTORY: 1.  Lymphoblastic leukemia diagnosed October 2014.  2.  Melanoma of the face, status post resection.  3.  Hypertension.  4.  Recurrent urinary tract infections.   SOCIAL HISTORY: The patient lives by herself,  but her daughter lives next door. [place] She does have pet dogs. She is outdoors quite a bit. No tobacco, alcohol or drugs.   FAMILY HISTORY: Mother with brain tumor. Father with heart disease.  REVIEW OF SYSTEMS:  Eleven systems reviewed and negative except as per HPI.   ALLERGIES: THE PATIENT IS ALLERGIC TO SULFA DRUGS, WHICH CAUSE NAUSEA.   ANTIBIOTICS SINCE ADMISSION: Include cefepime and vancomycin. At her last admission, she was treated with vancomycin and Zosyn and then discharged on Augmentin.   PHYSICAL EXAMINATION: VITAL SIGNS: Temperature currently 98.1, pulse 116, blood pressure 128/75, sat 93% on 3 liters, respirations 20. T-max since admission 102.7.  GENERAL: She is quite thin. She is chronically ill-appearing. She is lying in bed quite still, but she does have what appear to be some myoclonic jerks of her upper and lower extremities.  HEENT: Her pupils are equal, round, and reactive to light and accommodation. Her extraocular movements are not tested, as she is not cooperative with exam. She is anicteric. Her mucous membranes are quite dry.  NECK: Difficult to assess but appears supple.  LYMPHATIC: She has no anterior cervical, posterior cervical or supraclavicular lymphadenopathy.  HEART: Tachy but regular.  LUNGS: Clear.  ABDOMEN: Soft, nontender, nondistended. No hepatosplenomegaly.  EXTREMITIES: There is noted lower extremity edema. Her joints are all normal with no marked swelling, redness or erythema. She has a PICC line in her left upper extremity that appears within normal limits.  SKIN: She has no noted rash, shingles or decubitus ulcer.  NEUROLOGIC: She is quite noninteractive. She does open her eyes a little, but does not answer questions. She is able to move all 4 extremities.   LABORATORY AND RADIOLOGICAL DATA: Chest x-ray x 2 are negative. White blood count currently is 2.1, hemoglobin 12.5, platelets 191; MCV is 101; neutrophil count is 0.2. Last week  on admission, her neutrophil count was also quite low with a total white count of 0.8 and a neutrophil count of 0.3. Blood cultures done May 6 as well as May 11 are no growth to date. Urine culture had 7000 colony-forming units of gram-positive cocci, but her urinalysis on May 6 was negative. Troponin slightly positive at 0.12 on her last admission. AST and ALT are both elevated. Currently ALT is 101; AST is 90; alkaline phosphatase is 40; albumin is 2.2. Her renal function shows a creatinine of 0.82.   IMPRESSION: This is an 81 year old with acute lymphoblastic leukemia, on chemotherapy, who has been neutropenic status post chemotherapy and has had altered mental status, fevers, increased AST and ALT and has a PICC line in place. She is also having some what appear to be myoclonic jerks. She does have outdoor exposure and potential tick exposure.   She is persistently neutropenic and febrile. She does not have any evidence of mucositis or any other focal finding on exam or history, however, to point to a source. She is having a little bit of diarrhea, so will check a Clostridium difficile. I am  also concerned about her mental status, although her daughter says she clears up a little when her fever is brought down with Tylenol.   RECOMMENDATIONS: 1.  Will change cefepime to Zosyn for broader anaerobic coverage. Will continue vancomycin.  2.  I am going to add doxycycline in case of tickborne illness and [facility] as well as Ehrlichia.  3.  Repeat blood cultures are pending.  4.  If she continues to remain febrile and altered, I would suggest imaging of her CNS as well as possible lumbar puncture.   Thank you very much for the consult. I will be glad to follow with you.   ____________________________ Stann Mainlandavid P. Sampson GoonFitzgerald, MD dpf:jcm D: 01/13/2014 14:44:30 ET T: 01/13/2014 16:04:51 ET JOB#: [id number]  cc: Stann Mainlandavid P. Sampson GoonFitzgerald, MD, <Dictator> Dekari Bures Sampson GoonFITZGERALD  MD ELECTRONICALLY SIGNED 01/26/2014 20:58

## 2014-12-26 NOTE — Consult Note (Signed)
Chief Complaint:  Subjective/Chief Complaint no acute complaints, stronger, has been oob   VITAL SIGNS/ANCILLARY NOTES: **Vital Signs.:   08-May-15 04:30  Vital Signs Type Routine  Temperature Temperature (F) 98  Celsius 36.6  Temperature Source oral  Pulse Pulse 66  Respirations Respirations 18  Systolic BP Systolic BP 149  Diastolic BP (mmHg) Diastolic BP (mmHg) 77  Mean BP 101  Pulse Ox % Pulse Ox % 97  Pulse Ox Activity Level  At rest  Oxygen Delivery 2L    14:43  Temperature Temperature (F) 98.2  Celsius 36.7  Pulse Pulse 73  Respirations Respirations 18  Systolic BP Systolic BP 133  Diastolic BP (mmHg) Diastolic BP (mmHg) 71  Mean BP 91  Pulse Ox % Pulse Ox % 94  Pulse Ox Activity Level  At rest  Oxygen Delivery 2L   Brief Assessment:  GEN no acute distress   Respiratory normal resp effort   Gastrointestinal Normal   Additional Physical Exam neuro non focal alert and cooperative   Lab Results: Routine Chem:  08-May-15 05:04   Result Comment WBC - RESULTS VERIFIED BY REPEAT TESTING.  - CRITICAL VALUE PREVIOUSLY NOTIFIED.  - PREV. C/ 01-07-14 @1235  BY [facility].Marland Kitchen. AJO DIFFERENTIAL - DUE TO THE LOW WBC, THE INSTRUMENT DIFF  - CANNOT BE CONFIRMED BY MANUAL DIFF AND  - IS REPORTED PRIMARILY TO IDENTIFY CELL  - TYPES PRESENT.  - SMEAR SCANNED  Result(s) reported on 09 Jan 2014 at 05:49AM.  Routine Hem:  08-May-15 05:04   WBC (CBC)  0.6  RBC (CBC)  3.58  Hemoglobin (CBC) 12.4  Hematocrit (CBC) 35.2  Platelet Count (CBC) 167  MCV 99  MCH  34.6  MCHC 35.2  RDW  15.3  Neutrophil % 58.1  Lymphocyte % 23.3  Monocyte % 12.3  Eosinophil % 5.0  Basophil % 1.3  Neutrophil #  0.3  Lymphocyte #  0.1  Monocyte #  0.1  Eosinophil # 0.0  Basophil # 0.0   Assessment/Plan:  Assessment/Plan:  Assessment NEUTROPENIA SECONDARY TO CHEMOTX , CURRENTLY AFEBRILE.   6MP/ MERCAPTOPURINE CHEMOTX WAS GIVEN ON 5/6 AND 5/7 BUT SHOULD NOT HAVE BEEN GIVEN. NOW DISCONTINUED    Plan DISCONTINUE CHEMOTX/MERCAPTOPURINE.  CONTINUE NEUPOGEN. FOLLOW CBC DAILY F/U K LEVEL. AS ILLNESS RESPONDED TO IV ANTIBIOTICS, AND DEVELOPED WHILE ON OUT PATIENT LEVAQUIN,  NEED TO CONTINUE IV ANTIBIOTICS UNTIL NEUTROPHILS RECOVER   Electronic Signatures: Marin RobertsGittin, Sebastion Jun G (MD)  (Signed [phone]-May-15 18:46)  Authored: Chief Complaint, VITAL SIGNS/ANCILLARY NOTES, Brief Assessment, Lab Results, Assessment/Plan   Last Updated: 08-May-15 18:46 by Marin RobertsGittin, Fayez Sturgell G (MD)

## 2014-12-26 NOTE — Consult Note (Signed)
PATIENT NAME:  Sharon Suarez, Sharon Suarez MR#:  [id number] DATE OF BIRTH:  [birth year]  DATE OF CONSULTATION:  01/07/2014  REFERRING PHYSICIAN:   CONSULTING PHYSICIAN:  Knute Neu. Eriyanna Kofoed, MD  HOSPITAL COURSE: This is a patient well known to me, has seen me in the clinic on a regular basis.  She last had her labs seen 2 days ago. She was admitted today with decreased responsiveness, tachycardia, found to be neutropenic, had encephalopathy. Suspected to have possible SIRS but had not had high fever or chills. Essentially, she was feeling more fatigued and stayed in bed yesterday but did not call for medical attention. Today, she was more lethargic and also physically and mentally.  In the Emergency Room, she had tachycardia, neutropenia, baseline labs were checked. Cultures were done.  X-ray was unremarkable. Was sleeping and could not give history or system review initially, but later when I saw her, when aroused, she was alert, cooperative, could give her recent history. Oriented and was in no acute distress.   PAST MEDICAL HISTORY AND MEDICATION: Includes ALL/lymphoblastic lymphoma , initial treatment at [facility], recently on  monthly, vincristine and daily 6-mercaptopurine,  and weekly methotrexate.  Also on Valtrex daily prophylaxis 500 mg. Also Zofran p.r.n., Zantac 75 mg daily, Norvasc 10 mg daily, MiraLax and Colace p.r.n., Xanax at bedtime p.r.n.   FAMILY HISTORY: Noncontributory.   SOCIAL HISTORY: Not a smoker. No alcohol.   ALLERGIES: SULFAS CAUSED GASTROINTESTINAL DISTRESS PRIOR TO SURGERY.   PAST SURGICAL HISTORY:  She had surgery on her foot.  She has her tonsils. She had a melanoma and a facial skin graft. She has a PICC line in the left arm.   ADDITIONAL SYSTEM REVIEW: The patient denied to me any headache. No dizziness. No visual disturbance. No cough, was not out of breath. No ear or jaw or chest pain. No back pain. No abdominal pain. No palpitations.  Denied dysuria or hematuria. Denied  discomfort in the arm at the PICC site. Denied any skin wounds. Denied any increased bruising. No chills or sweats. Denied edema or focal weakness.  Admitted to general weakness.   PHYSICAL EXAMINATION:  GENERAL: Alert, cooperative, oriented.  NEUROLOGIC: Grossly nonfocal. No jaundice.  HEENT: No thrush.  LYMPHATIC: No palpable nodes in the neck or supraclavicular area. LUNGS: Were clear. No wheezing or rales.  HEART: Regular.  ABDOMEN: Benign, nontender. No palpable mass or organomegaly.  EXTREMITIES: There is no edema. The PICC line on the left.  There is no rash.   LABORATORY, DIAGNOSTIC, AND RADIOLOGICAL DATA: On admission, sodium 131, magnesium 1.1, AST elevated to 71. White count was 800, neutrophils are 600, platelets 190,000, hemoglobin 13.8, INR was 1.0.The urinalysis was unremarkable. Chest x-ray had no definite infiltrate.   ASSESSMENT: Patient neutropenic. Her white count was normal 2 days earlier, this could be progressive chemotherapy effect or infection suppressing the bone marrow and this could be viral infection as well, but she does not have any obvious signs or symptoms of  viral syndrome. Liver functions were unremarkable. They have waxed and waned prior, recently.  Usually, this was a methotrexate effect. There is no obvious source, but she has a PICC line that has been in place a long time. Was hypotensive on admission.   PLAN: She has already been started on broad-spectrum antibiotics. I have discussed with medicine. Started a dose of Neupogen tonight stat and then daily to recover the neutrophils. Check the electrolytes and CBC daily. Check liver functions every few days.  Await cultures. Adjust antibiotics p.r.n. for clinical changes or for culture findings. Again, hold the 6-MP, continue the Valtrex, and hold the amlodipine.    ____________________________ Knute Neuobert G. Lorre NickGittin, MD rgg:dd/am D: 01/07/2014 23:08:56 ET T: 01/08/2014 01:51:41 ET JOB#: [id number]  cc: Knute Neuobert G.  Lorre NickGittin, MD, <Dictator> Marin RobertsOBERT G Laelah Siravo MD ELECTRONICALLY SIGNED 01/28/2014 11:47

## 2014-12-26 NOTE — H&P (Signed)
PATIENT NAME:  Sharon Suarez, Sharon Suarez MR#:  [id number] DATE OF BIRTH:  [birth year]  DATE OF ADMISSION:  01/07/2014  PRIMARY CARE PROVIDER; She used to see Dr. Juel Burrow, but now only seeing Dr. Lorre Nick.  ED REFERRING DOCTOR: Dr. Mayford Knife.  CHIEF COMPLAINT: Decrease in responsiveness, tachycardia, neutropenia.   HISTORY OF PRESENT ILLNESS: The patient is an 81 year old white female with history of lymphoblastic lymphoma, who is being followed by Dr. Lorre Nick and is receiving IV vincristine drip at the end of the month; last received it on April 29. The patient normally is active and does well according to her granddaughter, who is at the bedside. The patient was doing well up until yesterday when she started becoming very weak and tired and fatigued and also was feeling hot. The patient earlier today got more lethargic and she could not get up. She was very weak and slow to respond; therefore, brought to the ED. The patient currently in the ED was noted to have tachycardia, was noted to have a lactic acid that was elevated and neutropenic. Her evaluation for infection so far has been nonrevealing including a negative UA and a chest x-ray. The patient currently is very sleepy and is not able to give me any further history.   PAST MEDICAL HISTORY:  1.  Significant for ALL, who has been followed by Dr. Lorre Nick and has been receiving vincristine. Has had received platelet transfusions and blood transfusions in the past.  2.  History of excision of melanoma of the face and skin with subsequent graft.   PAST SURGICAL HISTORY:  1.  Melanoma removal from the face and a subsequent skin graft.  2.  Status post foot surgery. 3.  Status post tonsillectomy.   ALLERGIES: SULFA, GIVES HER GI DISTRESS.   SOCIAL HISTORY: Does not smoke. Does not drink. No drugs.   FAMILY HISTORY: Positive for hypertension.   MEDICATIONS: She is on mercaptopurine50 mg 0.75 tabs daily, methotrexate 2.5, 8 tabs once a week, Zofran 4 mg 1 tab  p.o. t.i.d., Zantac 75 daily, Valtrex 500 daily, Norvasc 10 daily, Colace 100, 1 tab p.o. b.i.d., MiraLax 17 grams 2 times a day, senna 2 tabs at bedtime, (Dictation Anomaly)  0.5 at bedtime.   REVIEW OF SYSTEMS: Not obtainable due to the patient being sleepy and lethargic.   PHYSICAL EXAMINATION:  VITAL SIGNS: Temperature 98.1, pulse 108, respirations 18, blood pressure 133/64, O2 of 91%.  GENERAL: The patient is a thin female, appears dehydrated. She opens her eyes but hard to understand her.  HEENT: Head atraumatic, normocephalic. Pupils equally round, reactive to light and accommodation. There is no conjunctival pallor. No scleral icterus. Nasal exam shows no known drainage or ulceration. External ear exam shows no erythema or drainage. Oropharynx is very dry. No exudate.  NECK: Supple without any JVD.  CARDIOVASCULAR: Regular rate and rhythm tachycardic. No murmurs, rubs, clicks, or gallops.  LUNGS: Clear to auscultation bilaterally without any rales, rhonchi, wheezing.  ABDOMEN: Soft, nontender, nondistended. Positive bowel sounds x 4.  EXTREMITIES: No clubbing, cyanosis, or edema.  SKIN: No rash.  LYMPHATICS: No lymph nodes palpable.  VASCULAR: Good DP, PT pulses.  PSYCHIATRIC: Not anxious or depressed.  NEUROLOGICAL: The patient is very sleepy, and when she does wake up, she is hard to understand.   EVALUATIONS: Glucose 192, BUN 22, creatinine 1.19, sodium 131, potassium 3.5, chloride 91, CO2 is 31, magnesium 1.9. LFTs are normal except AST of 71. Troponin 0.09. WBC 0.8, hemoglobin 13.8, platelet count is  190. INR 1.0. Urinalysis is nitrite negative, leukocytes negative. Chest x-ray shows cardiomegaly and mild atelectasis. No other acute cardiopulmonary abnormality.   ASSESSMENT AND PLAN: The patient is a 81 year old with history of ALL, hypertension, history of melanoma, who presents with lethargy and weakness. Noted to be tachycardic.  1.  Possible sirs syndrome based on her  tachycardia and altered mental status. Her leukopenia at this time, due to immunocompromised state. Will place her on enteric IV vancomycin and Zosyn. Await her cultures. We will also ask her oncologist to see.  2.  Leukopenia. Oncology consult for further recommendations.  3.  Hypertension. We will hold amlodipine.  4.  History of ALL. We will continue mercaptopurine and methotrexate as taken previously.  5.  Hypertension. I will hold her Norvasc.  6.  Miscellaneous. The patient will be on Lovenox for DVT prophylaxis.  TIME SPENT ON THIS PATIENT: 50 minutes.   ____________________________ Lacie ScottsShreyang H. Allena KatzPatel, MD shp:aw D: 01/07/2014 12:58:27 ET T: 01/07/2014 13:15:10 ET JOB#: [id number]  cc: Keddrick Wyne H. Allena KatzPatel, MD, <Dictator> Charise CarwinSHREYANG H Alexandre Faries MD ELECTRONICALLY SIGNED 01/07/2014 16:17

## 2014-12-29 IMAGING — CT CT CHEST W/O CM
2 of 4 series · 15 of 36 positions shown, 18 images · non-contrast
Comparison: No prior chest CT.  Chest x-ray 01/15/2014.

CLINICAL DATA: Recent history of fevers now resolved. Still oxygen
dependent. Immunosuppressed patient due to ALL and chemotherapy.

EXAM:
CT CHEST WITHOUT CONTRAST
TECHNIQUE: Multidetector CT imaging of the chest was performed following the
standard protocol without IV contrast..

[Series 3: routine chest wo · axial · 0.70mm/px · z∈[-884,-654]mm · 12 of 56 slices shown, 15 images]
[im 5/56  mediastinal]
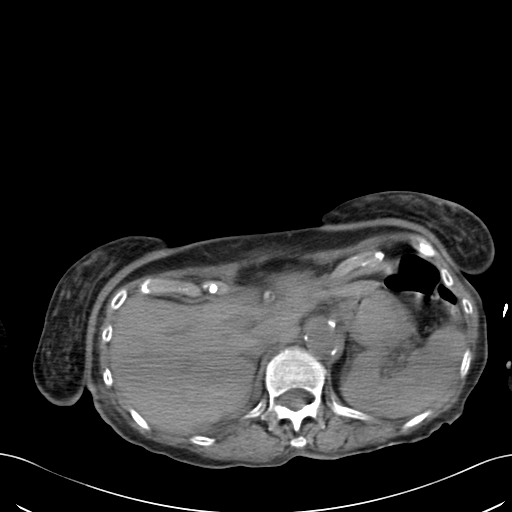
[im 5/56  lung]
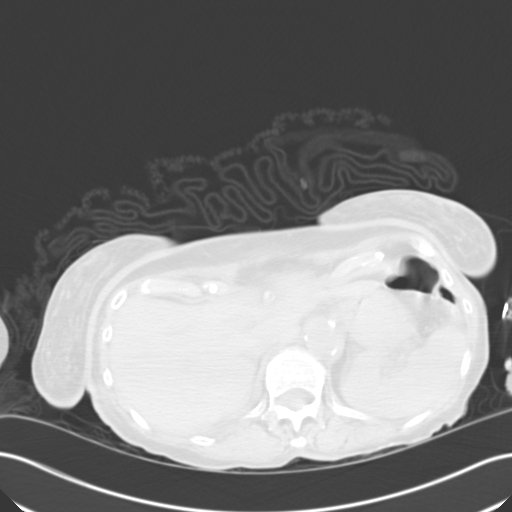
[im 9/56  lung]
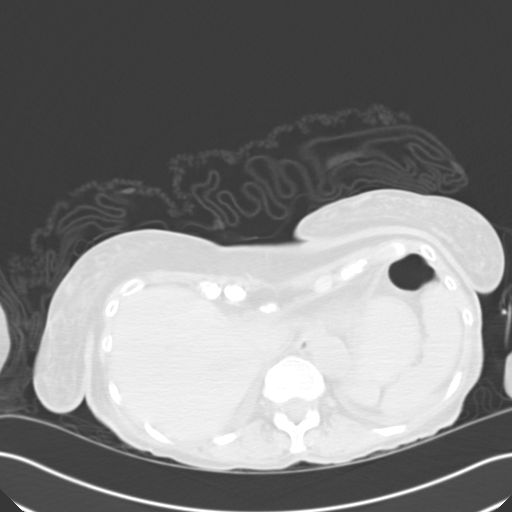
[im 13/56  lung]
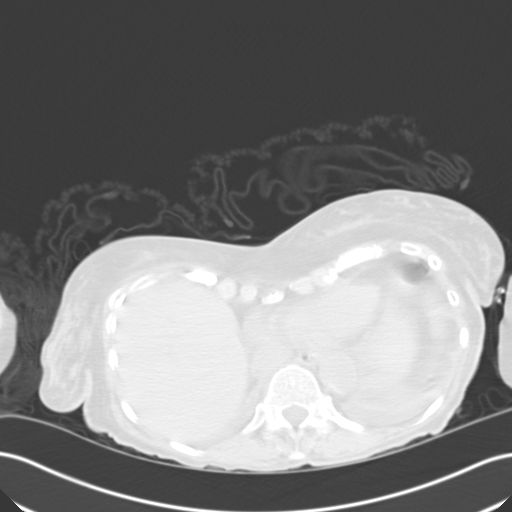
[im 17/56  lung]
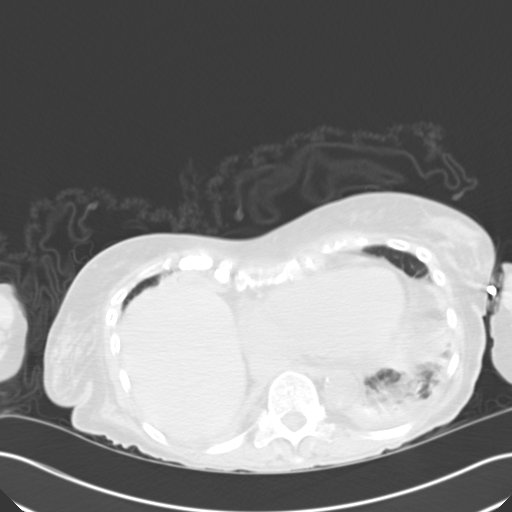
[im 22/56  mediastinal]
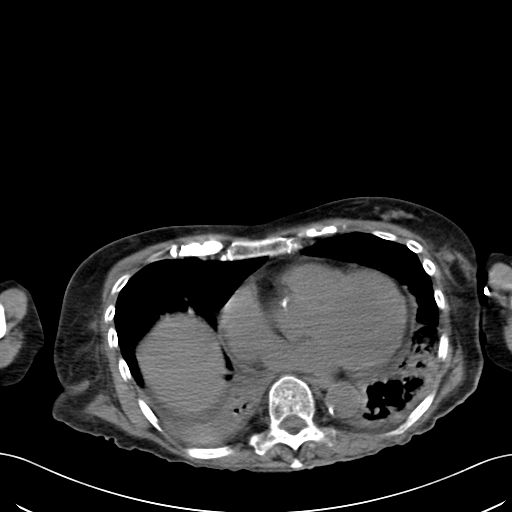
[im 22/56  lung]
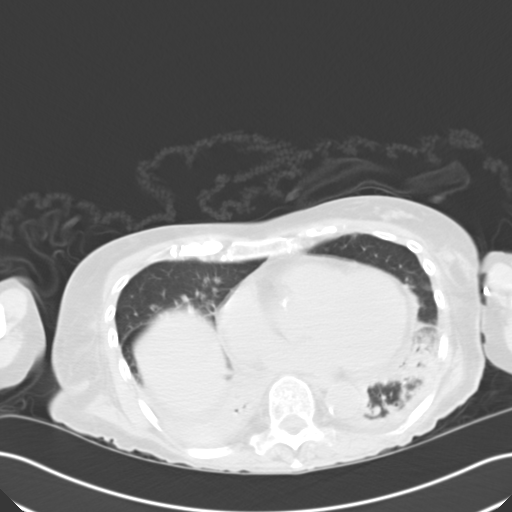
[im 26/56  lung]
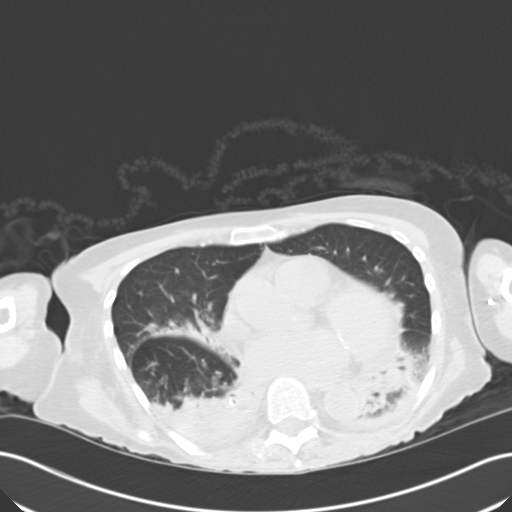
[im 30/56  lung]
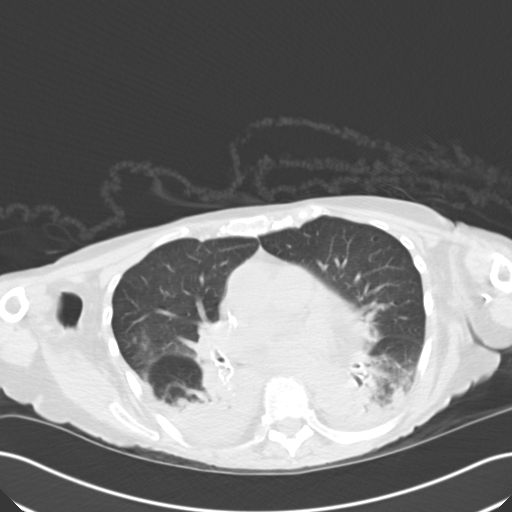
[im 34/56  lung]
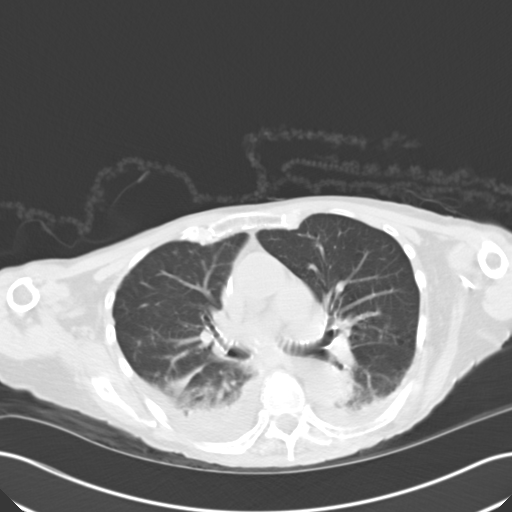
[im 39/56  mediastinal]
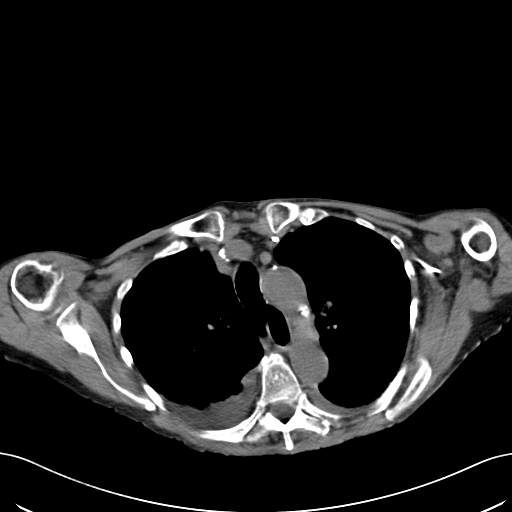
[im 39/56  lung]
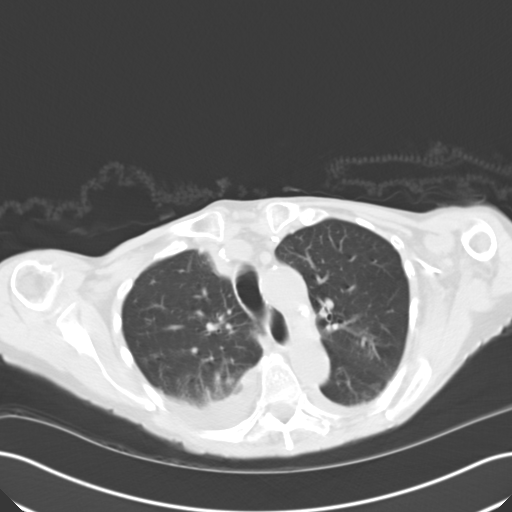
[im 43/56  lung]
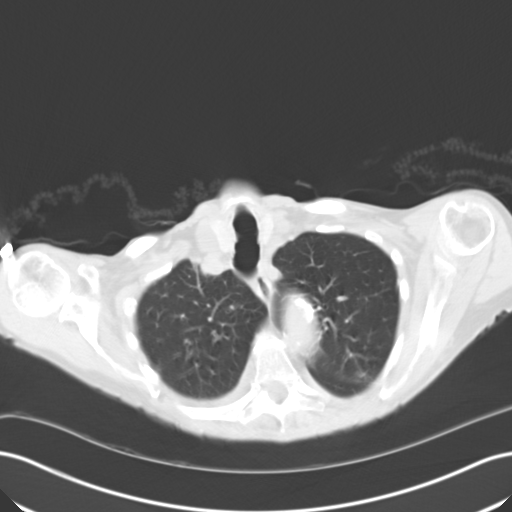
[im 47/56  lung]
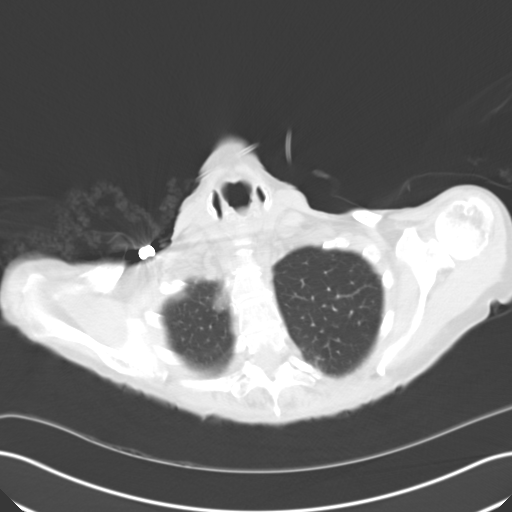
[im 51/56  lung]
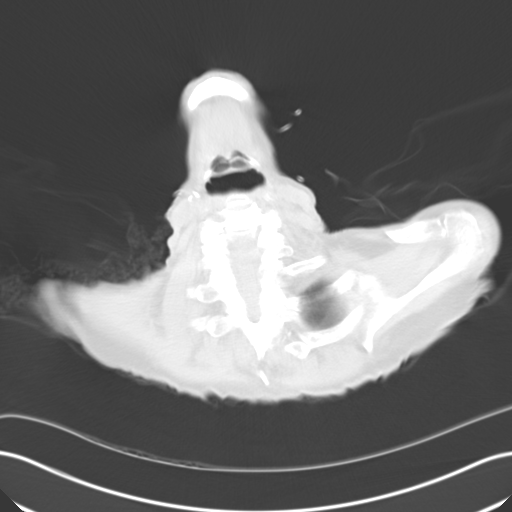

[Series 6: cor routine chest wo · coronal · 0.68mm/px · 3 of 97 slices shown]
[im 20/97  lung]
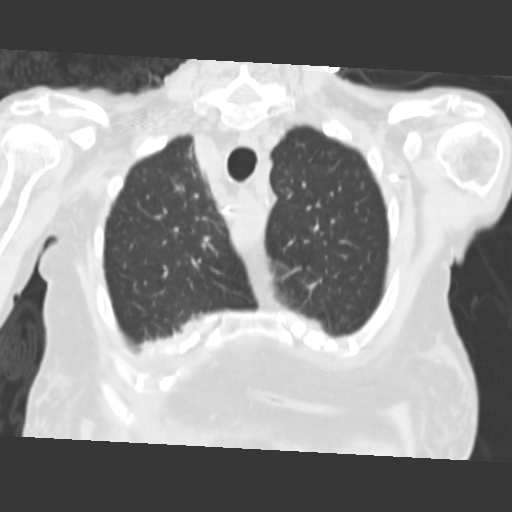
[im 39/97  lung]
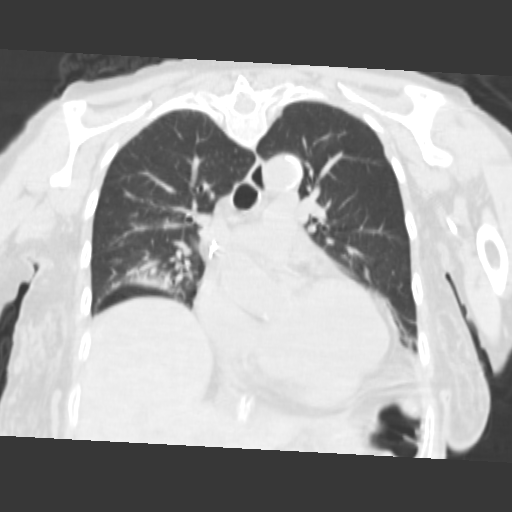
[im 58/97  lung]
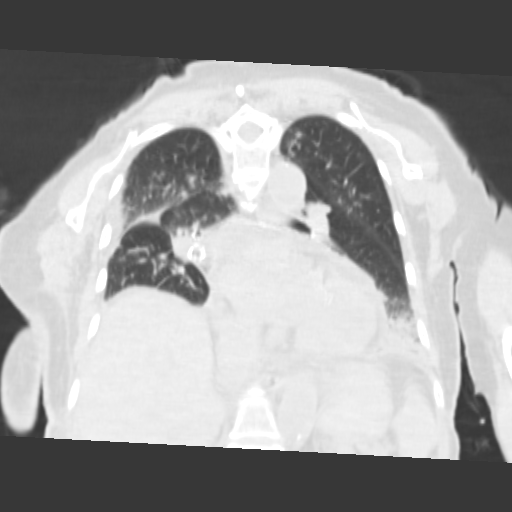

[15 of 36 positions shown; findings below may reference images not displayed]

FINDINGS: Mediastinum: Left upper extremity PICC with tip terminating in the
distal superior vena cava. Heart size is normal. There is no
significant pericardial fluid, thickening or pericardial
calcification. There is atherosclerosis of the thoracic aorta, the
great vessels of the mediastinum and the coronary arteries,
including calcified atherosclerotic plaque in the left main, left
anterior descending, left circumflex and right coronary arteries. No
pathologically enlarged mediastinal or hilar lymph nodes. Please
note that accurate exclusion of hilar adenopathy is limited on
noncontrast CT scans. Esophagus is unremarkable in appearance.

Lungs/Pleura: There is a combination of volume loss and airspace
consolidation in the dependent portions of the lungs bilaterally,
predominantly in the lower lobes with additional involvement of the
right middle lobe posteriorly. Trace bilateral pleural effusions
layering dependently. No definite suspicious appearing pulmonary
nodules or masses are identified.

Upper Abdomen: Unremarkable.

Musculoskeletal: Pectus excavatum. There are no aggressive appearing
lytic or blastic lesions noted in the visualized portions of the
skeleton.
IMPRESSION: 1. There are dependent areas of atelectasis and consolidation in the
lungs bilaterally, predominantly involving the lower lobes. Findings
are compatible with resolving pneumonia, and given the dependent
nature of the findings, possible aspiration pneumonia suspected.
2. Trace bilateral pleural effusions layering dependently.
3. Atherosclerosis, including left main and 3 vessel coronary artery
disease.
4. Additional incidental findings, as above.

## 2015-04-11 IMAGING — CT CT HEAD WITHOUT CONTRAST
1 series · 15 of 30 positions shown, 19 images · non-contrast
Comparison: None.

CLINICAL DATA: 87-year-old with current history of leukemia.
Episode of slurred speech and unsteady gait two days ago.

EXAM:
CT HEAD WITHOUT CONTRAST
TECHNIQUE: Contiguous axial images were obtained from the base of the skull
through the vertex without intravenous contrast.

[Series 2: head wo · axial · 0.43mm/px · z∈[-36,+99]mm · 15 of 31 slices shown, 19 images]
[im 2/31  brain]
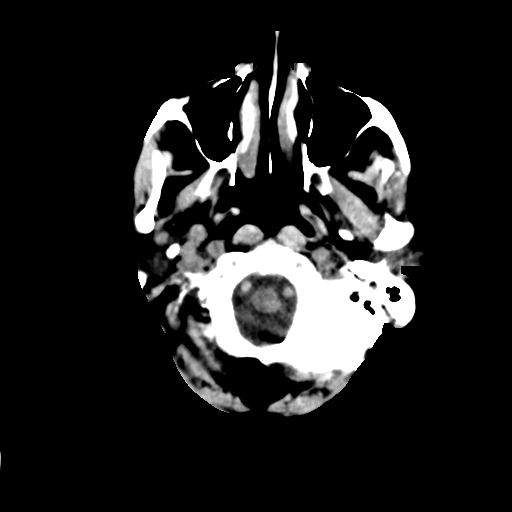
[im 2/31  bone]
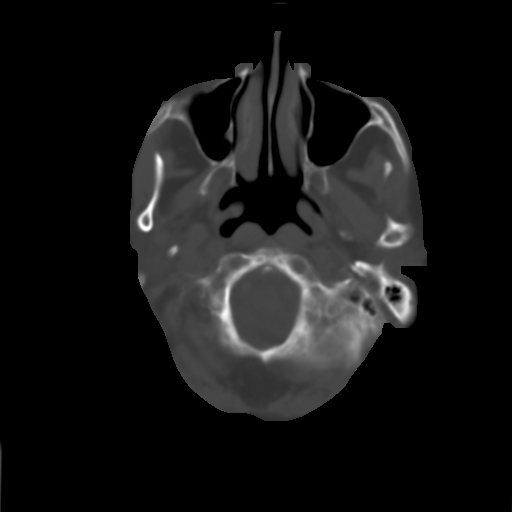
[im 4/31  brain]
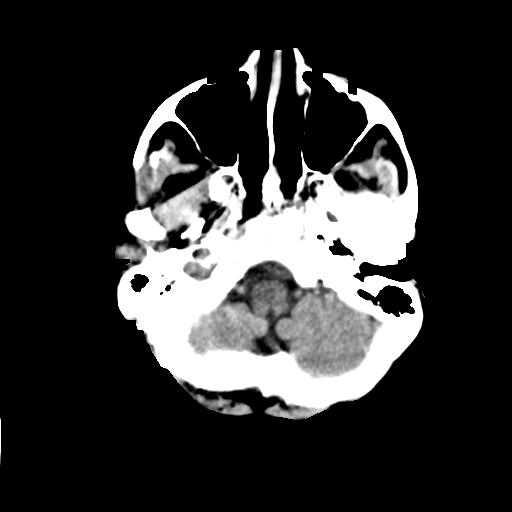
[im 6/31  brain]
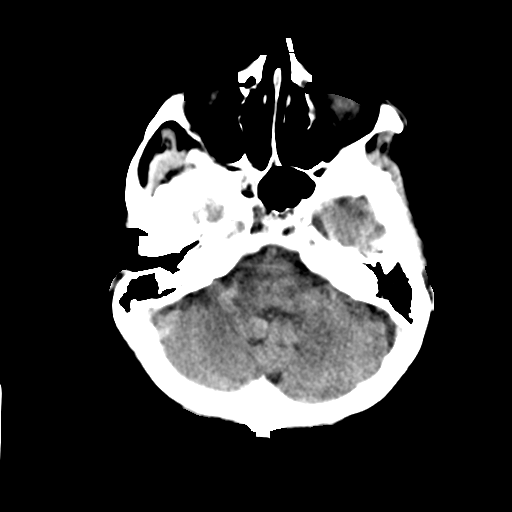
[im 8/31  brain]
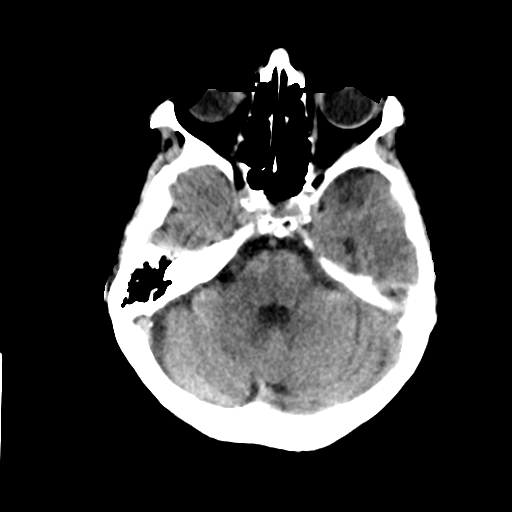
[im 10/31  brain]
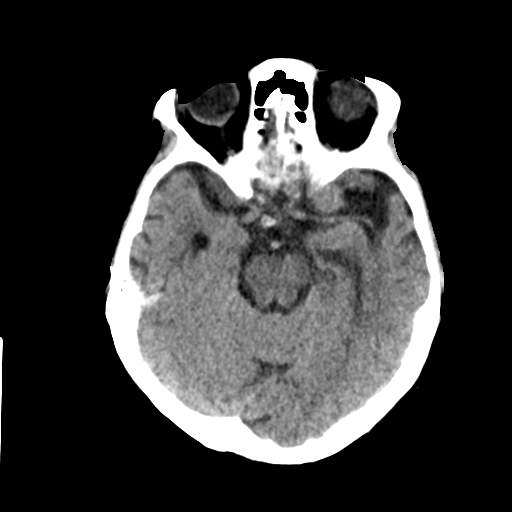
[im 10/31  bone]
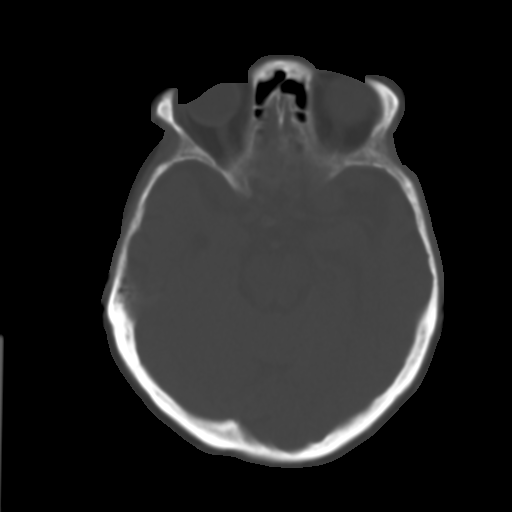
[im 12/31  brain]
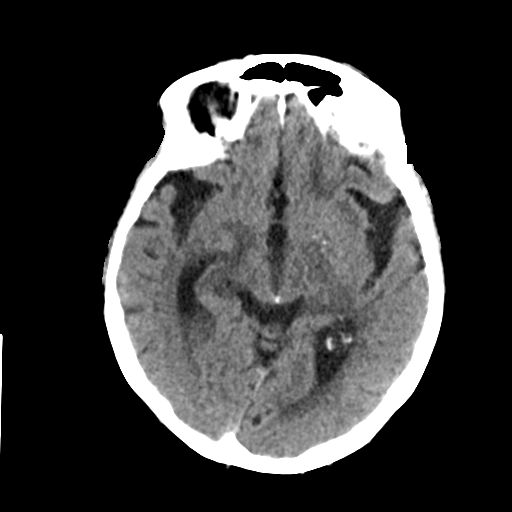
[im 14/31  brain]
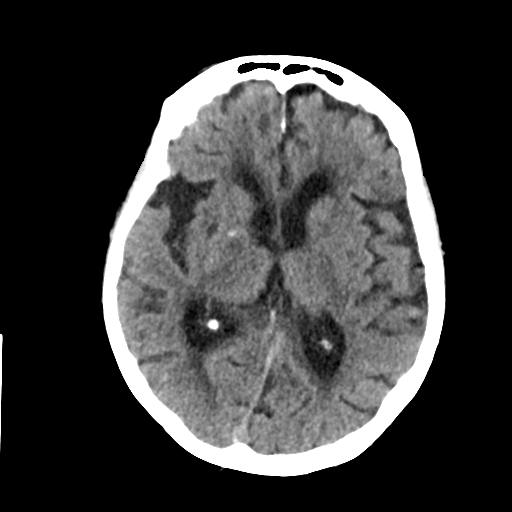
[im 16/31  brain]
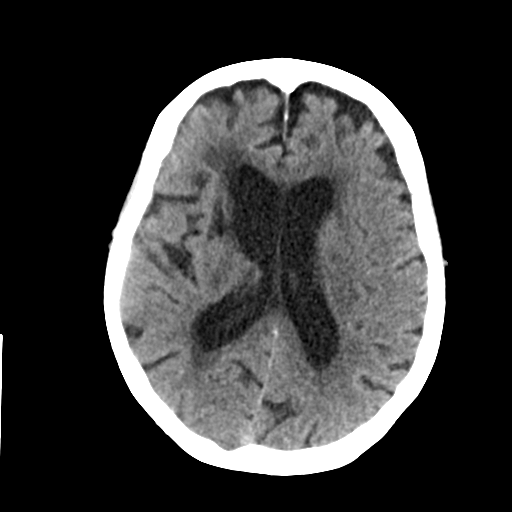
[im 17/31  brain]
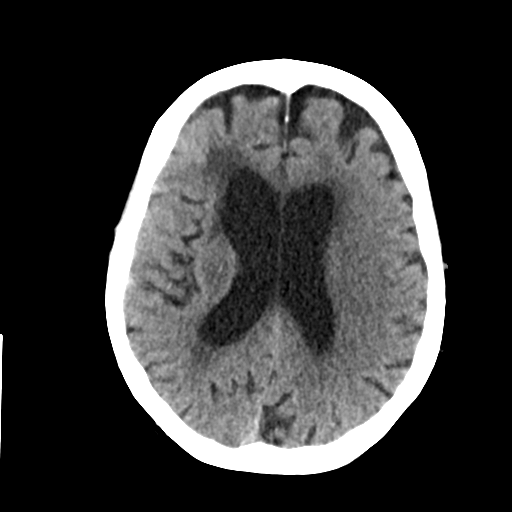
[im 17/31  bone]
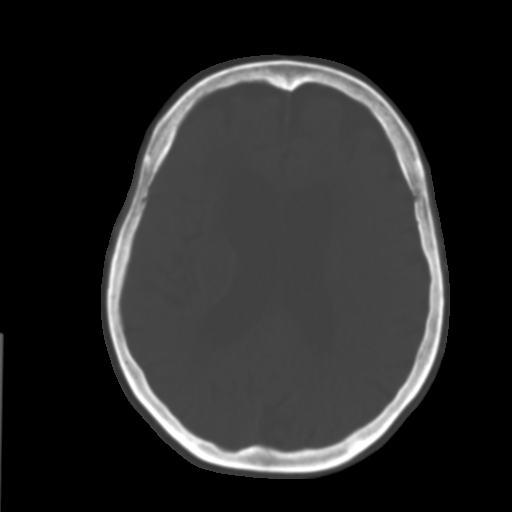
[im 19/31  brain]
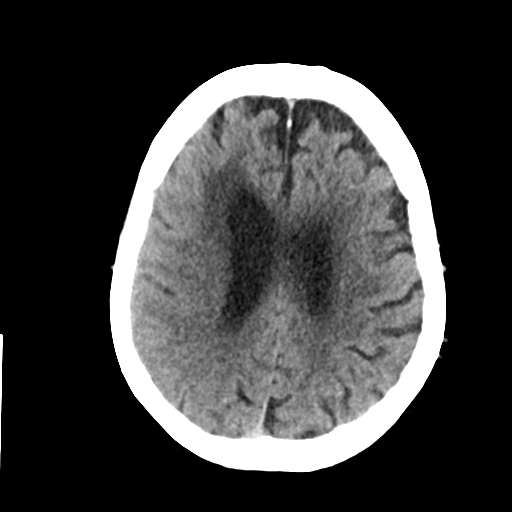
[im 21/31  brain]
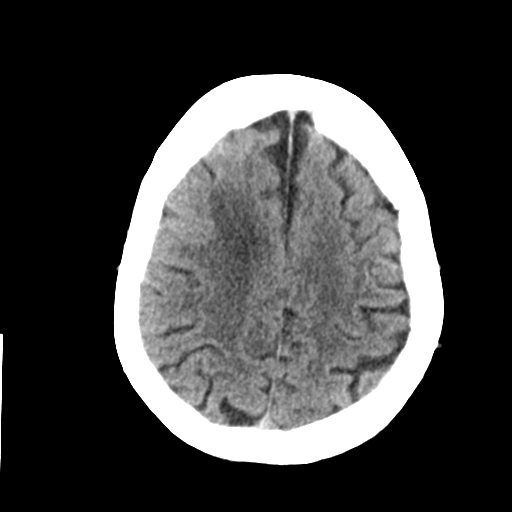
[im 23/31  brain]
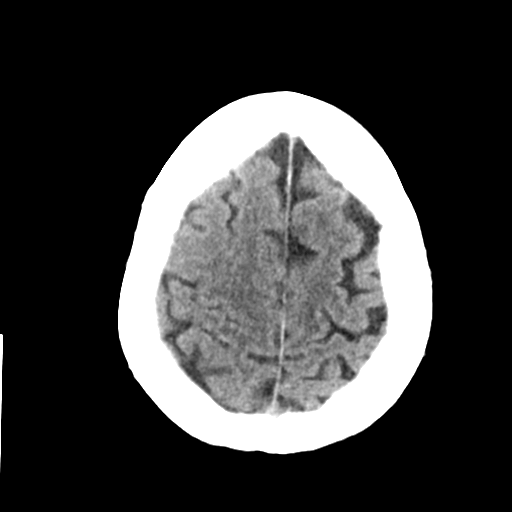
[im 25/31  brain]
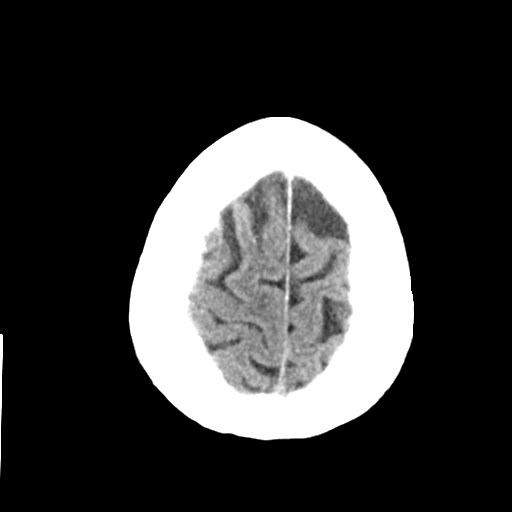
[im 25/31  bone]
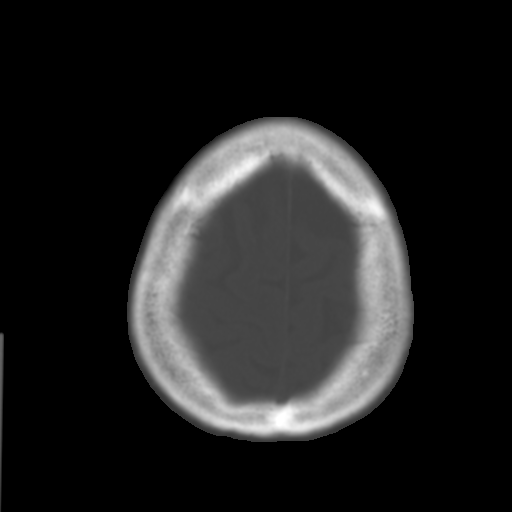
[im 27/31  brain]
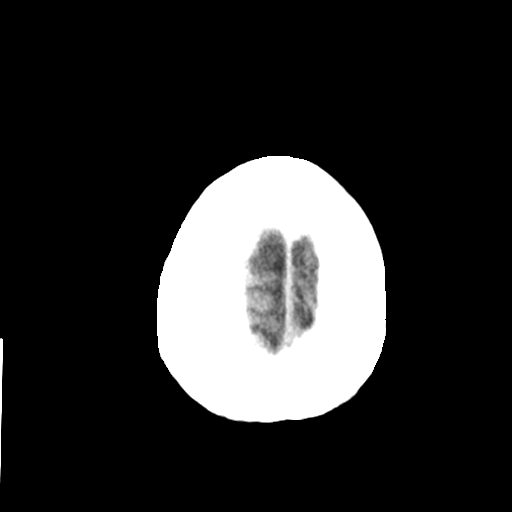
[im 29/31  brain]
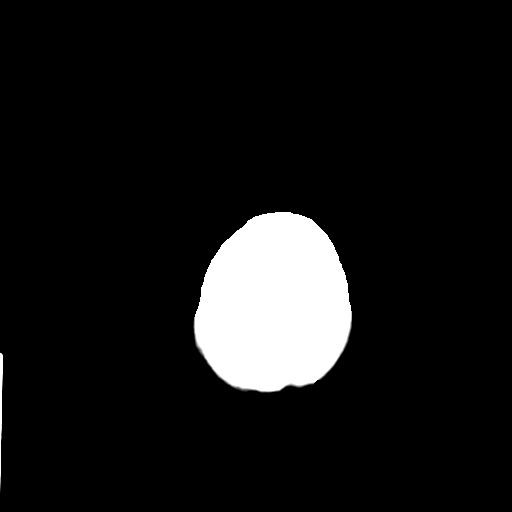

[15 of 30 positions shown; findings below may reference images not displayed]

FINDINGS: Moderate cortical and deep atrophy consistent with age. Severe
changes of small vessel disease of the white matter diffusely. Large
old lacunar type stroke in the right basal ganglia and smaller
lacunar strokes in the left basal ganglia. Lacunar strokes in the
thalami bilaterally. Physiologic calcifications in the basal ganglia
bilaterally. No mass lesion. No midline shift. No acute hemorrhage
or hematoma. No extra-axial fluid collections. No evidence of acute
infarction.

No skull fracture or other focal osseous abnormality involving the
skull. Visualized paranasal sinuses, bilateral mastoid air cells and
bilateral middle ear cavities well-aerated. Mild bilateral carotid
siphon atherosclerosis.
IMPRESSION: 1. No acute intracranial abnormality.
2. Moderate generalized atrophy and severe chronic microvascular
ischemic changes of the white matter.
3. Old lacunar strokes involving the basal ganglia and thalami
bilaterally.
